# Patient Record
Sex: Female | Born: 1965
Health system: Southern US, Community
[De-identification: ages and names within clinical notes are randomized; demographics above are authoritative.]

## PROBLEM LIST (undated history)

## (undated) DIAGNOSIS — M069 Rheumatoid arthritis, unspecified: Secondary | ICD-10-CM

## (undated) DIAGNOSIS — R079 Chest pain, unspecified: Secondary | ICD-10-CM

## (undated) DIAGNOSIS — M1990 Unspecified osteoarthritis, unspecified site: Secondary | ICD-10-CM

## (undated) DIAGNOSIS — R002 Palpitations: Secondary | ICD-10-CM

## (undated) DIAGNOSIS — I447 Left bundle-branch block, unspecified: Secondary | ICD-10-CM

## (undated) DIAGNOSIS — Z800 Family history of malignant neoplasm of digestive organs: Secondary | ICD-10-CM

## (undated) HISTORY — DX: Unspecified osteoarthritis, unspecified site: M19.90

## (undated) HISTORY — DX: Palpitations: R00.2

## (undated) HISTORY — PX: TONSILLECTOMY: SUR1361

## (undated) HISTORY — DX: Family history of malignant neoplasm of digestive organs: Z80.0

## (undated) HISTORY — DX: Chest pain, unspecified: R07.9

## (undated) HISTORY — DX: Rheumatoid arthritis, unspecified: M06.9

## (undated) HISTORY — DX: Left bundle-branch block, unspecified: I44.7

---

## 1993-05-21 HISTORY — PX: DILATION AND CURETTAGE OF UTERUS: SHX78

## 1998-01-13 ENCOUNTER — Other Ambulatory Visit: Admission: RE | Admit: 1998-01-13 | Discharge: 1998-01-13 | Payer: Self-pay | Admitting: Obstetrics and Gynecology

## 1998-04-02 ENCOUNTER — Inpatient Hospital Stay (HOSPITAL_COMMUNITY): Admission: AD | Admit: 1998-04-02 | Discharge: 1998-04-02 | Payer: Self-pay | Admitting: Obstetrics and Gynecology

## 1998-05-14 ENCOUNTER — Inpatient Hospital Stay (HOSPITAL_COMMUNITY): Admission: AD | Admit: 1998-05-14 | Discharge: 1998-05-17 | Payer: Self-pay | Admitting: Obstetrics and Gynecology

## 1998-07-04 ENCOUNTER — Other Ambulatory Visit: Admission: RE | Admit: 1998-07-04 | Discharge: 1998-07-04 | Payer: Self-pay | Admitting: Obstetrics and Gynecology

## 1999-09-05 ENCOUNTER — Other Ambulatory Visit: Admission: RE | Admit: 1999-09-05 | Discharge: 1999-09-05 | Payer: Self-pay | Admitting: *Deleted

## 1999-12-20 ENCOUNTER — Other Ambulatory Visit: Admission: RE | Admit: 1999-12-20 | Discharge: 1999-12-20 | Payer: Self-pay | Admitting: Obstetrics and Gynecology

## 2000-07-12 ENCOUNTER — Inpatient Hospital Stay (HOSPITAL_COMMUNITY): Admission: AD | Admit: 2000-07-12 | Discharge: 2000-07-14 | Payer: Self-pay | Admitting: Obstetrics and Gynecology

## 2000-08-20 ENCOUNTER — Other Ambulatory Visit: Admission: RE | Admit: 2000-08-20 | Discharge: 2000-08-20 | Payer: Self-pay | Admitting: Obstetrics and Gynecology

## 2001-09-30 ENCOUNTER — Other Ambulatory Visit: Admission: RE | Admit: 2001-09-30 | Discharge: 2001-09-30 | Payer: Self-pay | Admitting: Obstetrics & Gynecology

## 2002-10-27 ENCOUNTER — Other Ambulatory Visit: Admission: RE | Admit: 2002-10-27 | Discharge: 2002-10-27 | Payer: Self-pay | Admitting: Obstetrics and Gynecology

## 2003-04-29 ENCOUNTER — Ambulatory Visit (HOSPITAL_COMMUNITY): Admission: RE | Admit: 2003-04-29 | Discharge: 2003-04-29 | Payer: Self-pay | Admitting: Obstetrics and Gynecology

## 2003-07-08 ENCOUNTER — Ambulatory Visit (HOSPITAL_COMMUNITY): Admission: RE | Admit: 2003-07-08 | Discharge: 2003-07-08 | Payer: Self-pay | Admitting: Obstetrics and Gynecology

## 2003-11-23 ENCOUNTER — Inpatient Hospital Stay (HOSPITAL_COMMUNITY): Admission: AD | Admit: 2003-11-23 | Discharge: 2003-11-25 | Payer: Self-pay | Admitting: Obstetrics and Gynecology

## 2004-03-21 ENCOUNTER — Other Ambulatory Visit: Admission: RE | Admit: 2004-03-21 | Discharge: 2004-03-21 | Payer: Self-pay | Admitting: Obstetrics and Gynecology

## 2005-07-18 ENCOUNTER — Other Ambulatory Visit: Admission: RE | Admit: 2005-07-18 | Discharge: 2005-07-18 | Payer: Self-pay | Admitting: Obstetrics and Gynecology

## 2005-10-05 ENCOUNTER — Ambulatory Visit: Payer: Self-pay | Admitting: Internal Medicine

## 2005-10-29 ENCOUNTER — Ambulatory Visit: Payer: Self-pay | Admitting: Internal Medicine

## 2006-09-03 ENCOUNTER — Encounter: Admission: RE | Admit: 2006-09-03 | Discharge: 2006-09-03 | Payer: Self-pay | Admitting: Obstetrics and Gynecology

## 2008-12-07 ENCOUNTER — Emergency Department (HOSPITAL_COMMUNITY): Admission: EM | Admit: 2008-12-07 | Discharge: 2008-12-08 | Payer: Self-pay | Admitting: Emergency Medicine

## 2008-12-08 ENCOUNTER — Encounter: Admission: RE | Admit: 2008-12-08 | Discharge: 2008-12-08 | Payer: Self-pay | Admitting: Obstetrics and Gynecology

## 2009-12-19 ENCOUNTER — Encounter: Admission: RE | Admit: 2009-12-19 | Discharge: 2009-12-19 | Payer: Self-pay | Admitting: Rheumatology

## 2010-10-06 NOTE — H&P (Signed)
[facility]  Patient:    Hannah Vincent, Hannah Vincent                        MRN: [id number] Adm. Date:  07/12/00 Attending:  Silverio Lay, M.D.                         History and Physical  REASON FOR ADMISSION:         Intrauterine pregnancy at 40 weeks and 5 days to undergo elective induction of labor.  HISTORY OF PRESENT ILLNESS:   This is a 45 year old married white female, gravida 4, para 2, abortus 1 with a due date of July 07, 2000, being admitted at 40 weeks and 5 days to undergo elective induction of labor.  She was last seen in the office on July 09, 2000, reporting good fetal activity, denying any bleeding or watery discharge, and denying any symptoms of pregnancy-induced hypertension.  She did report increased contraction pattern but never to a regular pattern.  An ultrasound done on that day estimated fetal weight at 8 pounds 6 ounces or 60th percentile with 8/8 biophysical profile and amniotic fluid index at 12.5 or 52nd percentile.  PRENATAL COURSE:              Blood type O positive.  Toxoplasmosis negative. RPR nonreactive.  Rubella immune.  HBsAg negative.  HIV nonreactive.  Pap smear within normal limits.  Gonorrhea negative.  Chlamydia negative. Twelve-week ultrasound changed estimated date of delivery to July 07, 2000.  A 16-week AFP was within normal limits; 16-week amniocentesis was declined; 22-week ultrasound revealed normal anatomy survey with posterior placenta and normal cervical length.  A 28-week glucose tolerance test was within normal limits and hemoglobin at 11.6.  A 32-week hemoglobin was 12.9. A 35-week group B strep was positive.  Prenatal course was otherwise uneventful.  ALLERGIES:                    No known drug allergies.  PAST MEDICAL HISTORY:         December 1997, partial molar pregnancy at 8 weeks, D&C, no complication.  May 1997, spontaneous vaginal delivery of a female infant at 39+ weeks weighing 8  pounds 2 ounces, no complications.  December 1999, spontaneous vaginal delivery of a female infant weighing 8 pounds 1 ounce at 39+ weeks, no complications.  FAMILY HISTORY:               Significant for mother with chronic hypertension and occult spina bifida.  SOCIAL HISTORY:               Married, nonsmoker, homemaker.  PHYSICAL EXAMINATION:  VITAL SIGNS:                  Current weight 158 pounds for a total weight gain of 37 pounds.  Blood pressure 112/70.  HEENT:                        Negative.  LUNGS:                        Clear.  HEART:                        Normal.  ABDOMEN:  Gravid, nontender.  Fundal height at 39 cm, vertex presentation.  PELVIC:                       Vaginal exam 2 cm, 50% effaced, vertex, -1.  EXTREMITIES:                  Negative.  ASSESSMENT:                   1. Intrauterine pregnancy at 40 weeks and                                  5 days.                               2. Positive group B Streptococcus.                               3. Favorable cervix.  PLAN:                         Admit to labor and delivery.  Proceed with artificial rupture of membranes and Pitocin induction.  Spontaneous vaginal delivery expected.DD:  07/10/00 TD:  07/10/00 Job: [id number]

## 2010-10-06 NOTE — H&P (Signed)
NAME:  Hannah Vincent, Hannah Vincent                         ACCOUNT NO.:  [id number]   MEDICAL RECORD NO.:  [id number]                   PATIENT TYPE:  INP   LOCATION:  9165                                 FACILITY:  [facility]   PHYSICIAN:  Naima A. Dillard, M.D.              DATE OF BIRTH:  1965-09-07   DATE OF ADMISSION:  11/23/2003  DATE OF DISCHARGE:                                HISTORY & PHYSICAL   HISTORY:  Ms. Hagey is a 45 year old gravida 5, para 3, 0, 1, 3 at 39-4/7  weeks who presented without calling with uterine contractions increasing  since 9 p.m.  She denies leaking or bleeding and reports positive fetal  movement.  Pregnancy has been remarkable for:  1. Positive group B strep.  2. Advanced maternal age, no amniocentesis.  3. History of partial molar pregnancy in 1995.  4. Family history of spina bifida  5. History of preterm labor with term delivery.   PRENATAL LABS:  Blood type is O positive. Rh antibody negative. VDRL  nonreactive. Rubella titer positive.  Hepatitis B surface antigen negative.  HIV declined.  Cystic fibrosis testing was negative.  GC/Chlamydia cultures  were negative in the first trimester as well as at 36 weeks.  Pap was  normal.  Hemoglobin upon entering the practice was 14.3 and was 12.2 at 29  weeks.  Estimated date of confinement of November 23, 2003 was established by  ultrasound in the first trimester secondary to slightly questionable LMP.  Group B strep culture was positive at 36 weeks.  One hour Glucola was  normal.   HISTORY OF PRESENT PREGNANCY:  The patient entered care at approximately 12  weeks.  She had an ultrasound at that time secondary to history of molar  pregnancy. She declined amniocentesis and quadruple screen.  She had an  ultrasound at 21 weeks that showed normal growth and development, a positive  nasal bone and cervix at 3.8 cm.  She slipped on the ice at 21 weeks, did  not strike her abdomen, did have an x-ray of her left wrist which  was  negative.  She had a possible Fifth's disease exposure at 24 weeks.  She had  immune parva titers noted.  Her Glucola was normal.  At 36 weeks she had an  ultrasound showing a 6 pound, 6 ounce fetus with estimated fetal weight 50  to 75th percentile, vertex with normal fluid.  Positive beta strep was noted  at 36 weeks with negative CG/Chlamydia.  The rest of her pregnancy was  essentially uncomplicated.   OBSTETRICAL HISTORY:  In 1995 she had a molar pregnancy diagnosed at  approximately 13 weeks; she did have a D&C (that surgery was accomplished in  [place]).  In 1997 she had a vaginal birth of a female infant, weight 8 pounds,  2 ounces at 40 weeks; she was in labor 40 hours; she had epidural anesthesia  (the  child was born in [place] with no problems).  In 1999 she had a  vaginal birth of a female infant, weight 8 pounds, 1 ounce at 40 weeks; she  was in labor 12 hours; she had epidural anesthesia and had no problems.  The  child was delivered at [facility] by Dr. Estanislado Pandy.  In 2002 she had a  vaginal birth of a female infant, weight 8 pounds, 3 ounces at 40 weeks.  She was in labor 5 hours; she had epidural anesthesia, she was induced  secondary to post-dates.  That child was also delivered by Dr. Estanislado Pandy.  In  her 64 pregnancy she did have some mild preterm labor at 34 weeks.  She  had a low lying placenta with her 1997 pregnancy that did resolve at term.  She had a small amount of possible preterm labor in her 2002 pregnancy but  delivered at term.   PAST MEDICAL HISTORY:  She reports occasional yeast infections.  She has  been a user of natural family planning.  She has an indoor cat but she did  not change the litter box.  She has a history of urinary tract infections in  the past.   PAST SURGICAL HISTORY:  Includes tonsillectomy and adenoidectomy in high  school.  A D&C with her molar pregnancy in 1995.   ALLERGIES:  No known drug allergies.   FAMILY HISTORY:   Paternal grandfather had a myocardial infarction.  Maternal  grandmother had a stroke.  Mother has hypertension.  An aunt has varicose veins.  Maternal grandfather  had colon cancer.   GENETIC HISTORY:  Remarkable for the patient being 37.  Her mother also has  scoliosis and spina bifida.   SOCIAL HISTORY:  The patient is married to the father of the baby.  He is  involved and supportive, his name is [employer].  The patient is college  educated, she is a Futures trader.  Her husband has a Music therapist, he is  employed as an Art gallery manager.  She has been followed by the physician service at  [facility].  She denies any alcohol, drug or tobacco use during  this pregnancy.   PHYSICAL EXAMINATION:  VITAL SIGNS:  Initial blood pressure was 149/97,  however, patient was contracting very frequently.  Other vital signs are  stable. Uterine contractions every 2 to 3 minutes, 60 to 70 seconds in  duration, strong quality.  Fetal heart rate is reassuring with an occasional  mild variable with contraction.  PELVIC EXAM:  Cervix slightly posterior, 6 cm, 80%, vertex and -2 station  with an intact bag of water.  CHEST:  Clear.  HEENT:  Within normal limits.  LUNGS:  Breath sounds are clear.  HEART:  Regular rate and rhythm without murmur.  BREASTS:  Are soft and nontender.  ABDOMEN:  Fundal height approximately 38 cm, estimated fetal weight is 8  pounds.  EXTREMITIES:  Deep tendon reflexes are 2+ without clonus.  There is trace  edema noted.   IMPRESSION:  1. Intrauterine pregnancy at 39-4/7 weeks.  2. Active labor.  3. Positive group B strep.   PLAN:  1. Admit to birthing suite for consult with Dr. Normand Sloop as attending     physician.  2. Routine physician orders.  3. Plan group B strep prophylaxis with ampicillin 2 grams IV q.6h secondary     to possible rapidly advancing labor.  4. Patient plans epidural. 5. Medical doctors will follow.     Chip Boer  Carole Binning, C.N.M.                    Naima A. Normand Sloop, M.D.    Leeanne Mannan  D:  11/23/2003  T:  11/23/2003  Job:  [id number]

## 2011-06-06 ENCOUNTER — Other Ambulatory Visit: Payer: Self-pay | Admitting: Obstetrics and Gynecology

## 2011-06-06 DIAGNOSIS — Z1231 Encounter for screening mammogram for malignant neoplasm of breast: Secondary | ICD-10-CM

## 2011-06-22 ENCOUNTER — Ambulatory Visit
Admission: RE | Admit: 2011-06-22 | Discharge: 2011-06-22 | Disposition: A | Discharge status: Home / Self Care | Payer: BC Managed Care – PPO | Source: Ambulatory Visit | Attending: Obstetrics and Gynecology | Admitting: Obstetrics and Gynecology

## 2011-06-22 DIAGNOSIS — Z1231 Encounter for screening mammogram for malignant neoplasm of breast: Secondary | ICD-10-CM

## 2011-10-09 DIAGNOSIS — Z800 Family history of malignant neoplasm of digestive organs: Secondary | ICD-10-CM | POA: Insufficient documentation

## 2011-10-09 DIAGNOSIS — M069 Rheumatoid arthritis, unspecified: Secondary | ICD-10-CM | POA: Insufficient documentation

## 2011-10-12 ENCOUNTER — Encounter: Payer: Self-pay | Admitting: Obstetrics and Gynecology

## 2011-10-16 ENCOUNTER — Ambulatory Visit: Payer: Self-pay | Admitting: Obstetrics and Gynecology

## 2012-01-07 ENCOUNTER — Telehealth: Payer: Self-pay | Admitting: Obstetrics and Gynecology

## 2012-01-07 NOTE — Telephone Encounter (Signed)
PC from pt requesting advice for mother.  States she is having horrible urgency, no burning or stinging.  Notified pt that I cannot give medical advice over the phone for a non-patient, but suggest she establish PCP or visit urgent care.  Pt had questions about something she had seen in our office that said "if you have to run to the bathroom...." . Told pt that is an advertisement for Lumax testing.  Pt wanted to know if her mother could have that test.  I notified pt once again that I cannot give medical advise to a non-pt, but would suggest she establish a PCP or visit urgent care. Pt wanted to know a good PCP. Notified pt that SR refers a lot to Dr Dorothyann Peng.  Pt agreeable.  ld

## 2012-10-09 ENCOUNTER — Encounter: Payer: Self-pay | Admitting: Internal Medicine

## 2013-04-29 ENCOUNTER — Encounter: Payer: Self-pay | Admitting: Internal Medicine

## 2013-08-06 ENCOUNTER — Encounter: Payer: Self-pay | Admitting: Internal Medicine

## 2014-02-24 ENCOUNTER — Other Ambulatory Visit: Payer: Self-pay

## 2014-02-24 DIAGNOSIS — Z1239 Encounter for other screening for malignant neoplasm of breast: Secondary | ICD-10-CM

## 2014-03-22 ENCOUNTER — Encounter: Payer: Self-pay | Admitting: Obstetrics and Gynecology

## 2014-04-01 ENCOUNTER — Other Ambulatory Visit: Payer: Self-pay

## 2014-04-01 ENCOUNTER — Ambulatory Visit
Admission: RE | Admit: 2014-04-01 | Discharge: 2014-04-01 | Disposition: A | Discharge status: Home / Self Care | Payer: BC Managed Care – PPO | Source: Ambulatory Visit

## 2014-04-01 DIAGNOSIS — Z1231 Encounter for screening mammogram for malignant neoplasm of breast: Secondary | ICD-10-CM

## 2015-10-05 DIAGNOSIS — Z79899 Other long term (current) drug therapy: Secondary | ICD-10-CM | POA: Diagnosis not present

## 2015-10-05 DIAGNOSIS — M0589 Other rheumatoid arthritis with rheumatoid factor of multiple sites: Secondary | ICD-10-CM | POA: Diagnosis not present

## 2015-10-05 DIAGNOSIS — M0609 Rheumatoid arthritis without rheumatoid factor, multiple sites: Secondary | ICD-10-CM | POA: Diagnosis not present

## 2016-02-15 DIAGNOSIS — Z79899 Other long term (current) drug therapy: Secondary | ICD-10-CM | POA: Diagnosis not present

## 2016-03-26 DIAGNOSIS — L309 Dermatitis, unspecified: Secondary | ICD-10-CM | POA: Diagnosis not present

## 2016-03-30 DIAGNOSIS — L309 Dermatitis, unspecified: Secondary | ICD-10-CM | POA: Diagnosis not present

## 2016-04-06 DIAGNOSIS — Z79899 Other long term (current) drug therapy: Secondary | ICD-10-CM | POA: Diagnosis not present

## 2016-04-06 DIAGNOSIS — M0589 Other rheumatoid arthritis with rheumatoid factor of multiple sites: Secondary | ICD-10-CM | POA: Diagnosis not present

## 2016-04-17 DIAGNOSIS — Z01419 Encounter for gynecological examination (general) (routine) without abnormal findings: Secondary | ICD-10-CM | POA: Diagnosis not present

## 2016-04-17 DIAGNOSIS — Z6823 Body mass index (BMI) 23.0-23.9, adult: Secondary | ICD-10-CM | POA: Diagnosis not present

## 2016-04-17 DIAGNOSIS — Z3040 Encounter for surveillance of contraceptives, unspecified: Secondary | ICD-10-CM | POA: Diagnosis not present

## 2016-04-17 DIAGNOSIS — Z1231 Encounter for screening mammogram for malignant neoplasm of breast: Secondary | ICD-10-CM | POA: Diagnosis not present

## 2016-09-10 DIAGNOSIS — Z1211 Encounter for screening for malignant neoplasm of colon: Secondary | ICD-10-CM | POA: Diagnosis not present

## 2016-10-17 DIAGNOSIS — Z79899 Other long term (current) drug therapy: Secondary | ICD-10-CM | POA: Diagnosis not present

## 2016-10-17 DIAGNOSIS — M0589 Other rheumatoid arthritis with rheumatoid factor of multiple sites: Secondary | ICD-10-CM | POA: Diagnosis not present

## 2017-02-11 DIAGNOSIS — M79641 Pain in right hand: Secondary | ICD-10-CM | POA: Diagnosis not present

## 2017-02-11 DIAGNOSIS — M79642 Pain in left hand: Secondary | ICD-10-CM | POA: Diagnosis not present

## 2017-02-11 DIAGNOSIS — M0589 Other rheumatoid arthritis with rheumatoid factor of multiple sites: Secondary | ICD-10-CM | POA: Diagnosis not present

## 2017-02-11 DIAGNOSIS — Z79899 Other long term (current) drug therapy: Secondary | ICD-10-CM | POA: Diagnosis not present

## 2017-04-24 DIAGNOSIS — M542 Cervicalgia: Secondary | ICD-10-CM | POA: Diagnosis not present

## 2017-04-24 DIAGNOSIS — M2550 Pain in unspecified joint: Secondary | ICD-10-CM | POA: Diagnosis not present

## 2017-04-24 DIAGNOSIS — M0609 Rheumatoid arthritis without rheumatoid factor, multiple sites: Secondary | ICD-10-CM | POA: Diagnosis not present

## 2017-05-01 DIAGNOSIS — Z6823 Body mass index (BMI) 23.0-23.9, adult: Secondary | ICD-10-CM | POA: Diagnosis not present

## 2017-05-01 DIAGNOSIS — Z01419 Encounter for gynecological examination (general) (routine) without abnormal findings: Secondary | ICD-10-CM | POA: Diagnosis not present

## 2017-05-01 DIAGNOSIS — Z1231 Encounter for screening mammogram for malignant neoplasm of breast: Secondary | ICD-10-CM | POA: Diagnosis not present

## 2017-05-01 DIAGNOSIS — Z124 Encounter for screening for malignant neoplasm of cervix: Secondary | ICD-10-CM | POA: Diagnosis not present

## 2017-05-01 DIAGNOSIS — Z3040 Encounter for surveillance of contraceptives, unspecified: Secondary | ICD-10-CM | POA: Diagnosis not present

## 2017-06-05 DIAGNOSIS — M542 Cervicalgia: Secondary | ICD-10-CM | POA: Diagnosis not present

## 2017-06-05 DIAGNOSIS — M47812 Spondylosis without myelopathy or radiculopathy, cervical region: Secondary | ICD-10-CM | POA: Diagnosis not present

## 2017-07-24 DIAGNOSIS — M2550 Pain in unspecified joint: Secondary | ICD-10-CM | POA: Diagnosis not present

## 2017-07-24 DIAGNOSIS — Z79899 Other long term (current) drug therapy: Secondary | ICD-10-CM | POA: Diagnosis not present

## 2017-07-24 DIAGNOSIS — M0589 Other rheumatoid arthritis with rheumatoid factor of multiple sites: Secondary | ICD-10-CM | POA: Diagnosis not present

## 2017-10-23 DIAGNOSIS — M2550 Pain in unspecified joint: Secondary | ICD-10-CM | POA: Diagnosis not present

## 2017-10-23 DIAGNOSIS — M0589 Other rheumatoid arthritis with rheumatoid factor of multiple sites: Secondary | ICD-10-CM | POA: Diagnosis not present

## 2017-10-23 DIAGNOSIS — Z79899 Other long term (current) drug therapy: Secondary | ICD-10-CM | POA: Diagnosis not present

## 2017-10-23 DIAGNOSIS — M150 Primary generalized (osteo)arthritis: Secondary | ICD-10-CM | POA: Diagnosis not present

## 2018-02-24 DIAGNOSIS — M150 Primary generalized (osteo)arthritis: Secondary | ICD-10-CM | POA: Diagnosis not present

## 2018-02-24 DIAGNOSIS — Z79899 Other long term (current) drug therapy: Secondary | ICD-10-CM | POA: Diagnosis not present

## 2018-02-24 DIAGNOSIS — M2550 Pain in unspecified joint: Secondary | ICD-10-CM | POA: Diagnosis not present

## 2018-02-24 DIAGNOSIS — M0589 Other rheumatoid arthritis with rheumatoid factor of multiple sites: Secondary | ICD-10-CM | POA: Diagnosis not present

## 2018-05-27 DIAGNOSIS — J40 Bronchitis, not specified as acute or chronic: Secondary | ICD-10-CM | POA: Diagnosis not present

## 2018-06-04 DIAGNOSIS — M0589 Other rheumatoid arthritis with rheumatoid factor of multiple sites: Secondary | ICD-10-CM | POA: Diagnosis not present

## 2018-06-04 DIAGNOSIS — Z01419 Encounter for gynecological examination (general) (routine) without abnormal findings: Secondary | ICD-10-CM | POA: Diagnosis not present

## 2018-06-04 DIAGNOSIS — Z1211 Encounter for screening for malignant neoplasm of colon: Secondary | ICD-10-CM | POA: Diagnosis not present

## 2018-06-04 DIAGNOSIS — Z3040 Encounter for surveillance of contraceptives, unspecified: Secondary | ICD-10-CM | POA: Diagnosis not present

## 2018-06-04 DIAGNOSIS — Z1231 Encounter for screening mammogram for malignant neoplasm of breast: Secondary | ICD-10-CM | POA: Diagnosis not present

## 2018-09-01 DIAGNOSIS — M150 Primary generalized (osteo)arthritis: Secondary | ICD-10-CM | POA: Diagnosis not present

## 2018-09-01 DIAGNOSIS — M2550 Pain in unspecified joint: Secondary | ICD-10-CM | POA: Diagnosis not present

## 2018-09-01 DIAGNOSIS — M0589 Other rheumatoid arthritis with rheumatoid factor of multiple sites: Secondary | ICD-10-CM | POA: Diagnosis not present

## 2018-09-03 DIAGNOSIS — M0589 Other rheumatoid arthritis with rheumatoid factor of multiple sites: Secondary | ICD-10-CM | POA: Diagnosis not present

## 2018-09-16 DIAGNOSIS — R079 Chest pain, unspecified: Secondary | ICD-10-CM | POA: Diagnosis not present

## 2018-09-16 DIAGNOSIS — R002 Palpitations: Secondary | ICD-10-CM | POA: Diagnosis not present

## 2018-09-16 DIAGNOSIS — I447 Left bundle-branch block, unspecified: Secondary | ICD-10-CM | POA: Diagnosis not present

## 2018-09-17 ENCOUNTER — Telehealth: Payer: Self-pay | Admitting: *Deleted

## 2018-09-17 NOTE — Telephone Encounter (Signed)
REFERRAL SENT TO [facility] AND NOTES ON FILE FROM [place], [place] [phone].

## 2018-09-22 ENCOUNTER — Telehealth: Payer: Self-pay | Admitting: Cardiology

## 2018-09-22 NOTE — Telephone Encounter (Signed)
Virtual Visit Pre-Appointment Phone Call  "(Name), I am calling you today to discuss your upcoming appointment. We are currently trying to limit exposure to the virus that causes COVID-19 by seeing patients at home rather than in the office."  1. "What is the BEST phone number to call the day of the visit?" - include this in appointment notes  2. Do you have or have access to (through a family member/friend) a smartphone with video capability that we can use for your visit?" a. If yes - list this number in appt notes as cell (if different from BEST phone #) and list the appointment type as a VIDEO visit in appointment notes b. If no - list the appointment type as a PHONE visit in appointment notes  3. Confirm consent - "In the setting of the current Covid19 crisis, you are scheduled for a (phone or video) visit with your provider on (date) at (time).  Just as we do with many in-office visits, in order for you to participate in this visit, we must obtain consent.  If you'd like, I can send this to your mychart (if signed up) or email for you to review.  Otherwise, I can obtain your verbal consent now.  All virtual visits are billed to your insurance company just like a normal visit would be.  By agreeing to a virtual visit, we'd like you to understand that the technology does not allow for your provider to perform an examination, and thus may limit your provider's ability to fully assess your condition. If your provider identifies any concerns that need to be evaluated in person, we will make arrangements to do so.  Finally, though the technology is pretty good, we cannot assure that it will always work on either your or our end, and in the setting of a video visit, we may have to convert it to a phone-only visit.  In either situation, we cannot ensure that we have a secure connection.  Are you willing to proceed?  Yes/New pt/Telephone only/ [phone]/verbal consent 09/22/18/vitals  4. Advise  patient to be prepared - 5.  "Two hours prior to your appointment, go ahead and check your blood pressure, pulse, oxygen saturation, and your weight (if you have the equipment to check those) and write them all down. When your visit starts, your provider will ask you for this information. If you have an Apple Watch or Kardia device, please plan to have heart rate information ready on the day of your appointment. Please have a pen and paper handy nearby the day of the visit as well."  6. Give patient instructions for MyChart download to smartphone OR Doximity/Doxy.me as below if video visit (depending on what platform provider is using)  7. Inform patient they will receive a phone call 15 minutes prior to their appointment time (may be from unknown caller ID) so they should be prepared to answer    TELEPHONE CALL NOTE  Hannah Vincent has been deemed a candidate for a follow-up tele-health visit to limit community exposure during the Covid-19 pandemic. I spoke with the patient via phone to ensure availability of phone/video source, confirm preferred email & phone number, and discuss instructions and expectations.  I reminded Hannah Vincent to be prepared with any vital sign and/or heart rhythm information that could potentially be obtained via home monitoring, at the time of her visit. I reminded Hannah Vincent to expect a phone call prior to her visit.  Hannah Vincent 09/22/2018 3:45 PM   INSTRUCTIONS FOR DOWNLOADING THE MYCHART APP TO SMARTPHONE  - The patient must first make sure to have activated MyChart and know their login information - If Apple, go to [employer] and type in MyChart in the search bar and download the app. If Android, ask patient to go to [employer] and type in [place] in the search bar and download the app. The app is free but as with any other app downloads, their phone may require them to verify saved payment information or Apple/Android password.  - The patient  will need to then log into the app with their MyChart username and password, and select [facility] as their healthcare provider to link the account. When it is time for your visit, go to the MyChart app, find appointments, and click Begin Video Visit. Be sure to Select Allow for your device to access the Microphone and Camera for your visit. You will then be connected, and your provider will be with you shortly.  **If they have any issues connecting, or need assistance please contact MyChart service desk [phone])**  **If using a computer, in order to ensure the best quality for their visit they will need to use either of the following Internet Browsers: [employer] or Google Chrome**  IF USING DOXIMITY or DOXY.ME - The patient will receive a link just prior to their visit by text.     FULL LENGTH CONSENT FOR TELE-HEALTH VISIT   I hereby voluntarily request, consent and authorize [facility] and its employed or contracted physicians, physician assistants, nurse practitioners or other licensed health care professionals (the Practitioner), to provide me with telemedicine health care services (the Services") as deemed necessary by the treating Practitioner. I acknowledge and consent to receive the Services by the Practitioner via telemedicine. I understand that the telemedicine visit will involve communicating with the Practitioner through live audiovisual communication technology and the disclosure of certain medical information by electronic transmission. I acknowledge that I have been given the opportunity to request an in-person assessment or other available alternative prior to the telemedicine visit and am voluntarily participating in the telemedicine visit.  I understand that I have the right to withhold or withdraw my consent to the use of telemedicine in the course of my care at any time, without affecting my right to future care or treatment, and that the Practitioner  or I may terminate the telemedicine visit at any time. I understand that I have the right to inspect all information obtained and/or recorded in the course of the telemedicine visit and may receive copies of available information for a reasonable fee.  I understand that some of the potential risks of receiving the Services via telemedicine include:   Delay or interruption in medical evaluation due to technological equipment failure or disruption;  Information transmitted may not be sufficient (e.g. poor resolution of images) to allow for appropriate medical decision making by the Practitioner; and/or   In rare instances, security protocols could fail, causing a breach of personal health information.  Furthermore, I acknowledge that it is my responsibility to provide information about my medical history, conditions and care that is complete and accurate to the best of my ability. I acknowledge that Practitioner's advice, recommendations, and/or decision may be based on factors not within their control, such as incomplete or inaccurate data provided by me or distortions of diagnostic images or specimens that may result from electronic transmissions. I understand that the  practice of medicine is not an Chief Strategy Officer and that Practitioner makes no warranties or guarantees regarding treatment outcomes. I acknowledge that I will receive a copy of this consent concurrently upon execution via email to the email address I last provided but may also request a printed copy by calling the office of [facility].    I understand that my insurance will be billed for this visit.   I have read or had this consent read to me.  I understand the contents of this consent, which adequately explains the benefits and risks of the Services being provided via telemedicine.   I have been provided ample opportunity to ask questions regarding this consent and the Services and have had my questions answered to my  satisfaction.  I give my informed consent for the services to be provided through the use of telemedicine in my medical care  By participating in this telemedicine visit I agree to the above.

## 2018-09-23 NOTE — Progress Notes (Signed)
Virtual Visit via Video Note   This visit type was conducted due to national recommendations for restrictions regarding the COVID-19 Pandemic (e.g. social distancing) in an effort to limit this patient's exposure and mitigate transmission in our community.  Due to her co-morbid illnesses, this patient is at least at moderate risk for complications without adequate follow up.  This format is felt to be most appropriate for this patient at this time.  All issues noted in this document were discussed and addressed.  A limited physical exam was performed with this format.  Please refer to the patient's chart for her consent to telehealth for [facility].   Evaluation Performed: Cardiology consult  This visit type was conducted due to national recommendations for restrictions regarding the COVID-19 Pandemic (e.g. social distancing).  This format is felt to be most appropriate for this patient at this time.  All issues noted in this document were discussed and addressed.  No physical exam was performed (except for noted visual exam findings with Video Visits).  Please refer to the patient's chart (MyChart message for video visits and phone note for telephone visits) for the patient's consent to telehealth for [facility].  Date:  09/24/2018   ID:  Hannah Vincent, DOB 01/28/1966, MRN [id number]  Patient Location:  Home  Provider location:   [place]  PCP: Hannah Gemma, PA  Cardiologist:  NEW Electrophysiologist:  None   Chief Complaint:  Palpitations and LBBB  History of Present Illness:    Hannah Vincent is a 53 y.o. female who presents via audio/video conferencing for a telehealth visit today in referral by Hannah Gemma PA-C for evaluation of LBBB, chest pain and palpitations.   This is a very pleasant 53yo female with a history of RA who was seen for recent OV and complained that she was having sporadic palpitations as well as chest pain.  On EKG she was found to have a LBBB  with no old EKG to compare. Unfortunately she lost both of her parents within a 5 months time frame and has been very stressed.    She tells me that the CP is very sporadic and has been occurring for over several weeks.  She describes it as a pressure that is midsternal with no radiation to her arms or neck.  There is no associated nausea, diaphoresis or shortness of breath.  She says sometimes she will feel it during the day but notices it at night.  She said a couple times she is gone walking or jogging and has had no chest pain but then when she gets back and rest she starts to have the pressure in her chest.  She says this week her chest pain and palpitations have been a little bit better than before.  2 weeks prior to this though the palpitations and chest discomfort were very bad especially at night.  She also will feel a fullness in her throat and feel her heart racing.   She has never smoked.  She drinks 1 large cup of coffee in the morning and occasionally drinks alcohol but not very often.  She denies any SOB, DOE, LE edema or syncope.  She sometimes will feel dizzy with the palpitations.  Her dad had an MI late in life but died of a cerebral bleed after hitting his head on blood thinners.  Her mother is deceased and had hyper tension and CHF.  The patient does not have symptoms concerning for COVID-19 infection (fever, chills, cough,  or new shortness of breath).    Prior CV studies:   The following studies were reviewed today:  none  Past Medical History:  Diagnosis Date   Arthritis    rheumatoid   Chest pain    Family history of colon cancer    LBBB (left bundle branch block)    Palpitations    RA (rheumatoid arthritis) (HCC)    Past Surgical History:  Procedure Laterality Date   DILATION AND CURETTAGE OF UTERUS  1995   partial mole   TONSILLECTOMY       Current Meds  Medication Sig   Calcium Carbonate-Vitamin D (CALCIUM 500 + D PO) Take by mouth.   folic acid  (FOLVITE) 1 MG tablet Take 1 mg by mouth daily.   meloxicam (MOBIC) 15 MG tablet Take 15 mg by mouth daily.   methotrexate (RHEUMATREX) 2.5 MG tablet Take 2.5 mg by mouth once a week. Caution:Chemotherapy. Protect from light.     Allergies:   Other   Social History   Tobacco Use   Smoking status: Never Smoker   Smokeless tobacco: Never Used  Substance Use Topics   Alcohol use: No   Drug use: No     Family Hx: The patient's family history includes Birth defects in her mother; Cancer (age of onset: 31) in her maternal grandfather; Heart disease in her father; Hypertension in her mother.  ROS:   Please see the history of present illness.     All other systems reviewed and are negative.   Labs/Other Tests and Data Reviewed:    Recent Labs: No results found for requested labs within last 8760 hours.   Recent Lipid Panel No results found for: CHOL, TRIG, HDL, CHOLHDL, LDLCALC, LDLDIRECT  Wt Readings from Last 3 Encounters:  No data found for Wt     Objective:    Vital Signs:  BP 125/81    Pulse 61    Ht 5\' 4"  (1.626 m)    CONSTITUTIONAL:  Well nourished, well developed female in no acute distress.  EYES: anicteric MOUTH: oral mucosa is pink RESPIRATORY: Normal respiratory effort, symmetric expansion CARDIOVASCULAR: No peripheral edema SKIN: No rash, lesions or ulcers MUSCULOSKELETAL: no digital cyanosis NEURO: Cranial Nerves II-XII grossly intact, moves all extremities PSYCH: Intact judgement and insight.  A&O x 3, Mood/affect appropriate   ASSESSMENT & PLAN:    1.  Chest pain -her chest pain is somewhat atypical in that it mainly occurs at night when she is in bed.  She does notice that she can go out on a jog or walk and will not get chest pain during the exercise but when she stopped exercising she may feel some pressure in her chest.  There are no associated symptoms and no radiation of the discomfort.  She does have cardiac risk factors including a family  history of heart disease although later in life and she has autoimmune disorder with her rheumatoid arthritis.  She has never smoked.  Her EKG is abnormal and left bundle branch block.  I recommended a Lexiscan Myoview to rule out ischemia.  I will also get a 2D echocardiogram to make sure LV function is normal.  2.  Palpitations - she can feel her heart racing at any time but especially at night.  I will get a long term ziopatch to assess for arrhythmias especially PAF.  3.  LBBB - I do not know if this is new as there are no prior EKGs to compare to.  4.  COVID-19 Education:The signs and symptoms of COVID-19 were discussed with the patient and how to seek care for testing (follow up with PCP or arrange E-visit).  The importance of social distancing was discussed today.  Patient Risk:   After full review of this patient's clinical status, I feel that they are at least moderate risk at this time.  Time:   Today, I have spent 20 minutes directly with the patient on video discussing medical problems including CP, palpitations, abnormal EKG.  We also reviewed the symptoms of COVID 19 and the ways to protect against contracting the virus with telehealth technology.  I spent an additional 5 minutes reviewing patient's chart including office notes from PCP.  Medication Adjustments/Labs and Tests Ordered: Current medicines are reviewed at length with the patient today.  Concerns regarding medicines are outlined above.  Tests Ordered: No orders of the defined types were placed in this encounter.  Medication Changes: No orders of the defined types were placed in this encounter.   Disposition:  Follow up PRN  Signed, Armanda Magicraci Durward Matranga, MD  09/24/2018 9:37 AM    [facility]

## 2018-09-24 ENCOUNTER — Encounter: Payer: Self-pay | Admitting: Cardiology

## 2018-09-24 ENCOUNTER — Other Ambulatory Visit: Payer: Self-pay

## 2018-09-24 ENCOUNTER — Telehealth: Payer: Self-pay | Admitting: Radiology

## 2018-09-24 ENCOUNTER — Telehealth (INDEPENDENT_AMBULATORY_CARE_PROVIDER_SITE_OTHER): Payer: BLUE CROSS/BLUE SHIELD | Admitting: Cardiology

## 2018-09-24 VITALS — BP 125/81 | HR 61 | Ht 64.0 in

## 2018-09-24 DIAGNOSIS — Z7189 Other specified counseling: Secondary | ICD-10-CM

## 2018-09-24 DIAGNOSIS — I447 Left bundle-branch block, unspecified: Secondary | ICD-10-CM

## 2018-09-24 DIAGNOSIS — R0789 Other chest pain: Secondary | ICD-10-CM

## 2018-09-24 DIAGNOSIS — R002 Palpitations: Secondary | ICD-10-CM

## 2018-09-24 DIAGNOSIS — R079 Chest pain, unspecified: Secondary | ICD-10-CM

## 2018-09-24 NOTE — Telephone Encounter (Signed)
Enrolled patient for a 14 day Zio long Term monitor to be mailed due to covid-19. Brief instructions were gone over with the patient and she knows to expect the monitor to arrive in 3-4 days.  

## 2018-09-24 NOTE — Addendum Note (Signed)
Addended by: Dustin Flock on: 09/24/2018 11:39 AM   Modules accepted: Orders

## 2018-09-24 NOTE — Patient Instructions (Signed)
Medication Instructions:  Your physician recommends that you continue on your current medications as directed. Please refer to the Current Medication list given to you today.  If you need a refill on your cardiac medications before your next appointment, please call your pharmacy.   Lab work: None If you have labs (blood work) drawn today and your tests are completely normal, you will receive your results only by: Marland Kitchen MyChart Message (if you have MyChart) OR . A paper copy in the mail If you have any lab test that is abnormal or we need to change your treatment, we will call you to review the results.  Testing/Procedures: Your physician has requested that you have an echocardiogram. Echocardiography is a painless test that uses sound waves to create images of your heart. It provides your doctor with information about the size and shape of your heart and how well your heart's chambers and valves are working. This procedure takes approximately one hour. There are no restrictions for this procedure.  Your physician has requested that you have a lexiscan myoview. For further information please visit [email or web address] Please follow instruction sheet, as given.  Schedule a Zio Patch Monitor, 14 day  Follow-Up: As needed, pending results.

## 2018-10-01 ENCOUNTER — Ambulatory Visit (INDEPENDENT_AMBULATORY_CARE_PROVIDER_SITE_OTHER): Payer: BLUE CROSS/BLUE SHIELD

## 2018-10-01 DIAGNOSIS — R002 Palpitations: Secondary | ICD-10-CM

## 2018-10-17 ENCOUNTER — Telehealth (HOSPITAL_COMMUNITY): Payer: Self-pay | Admitting: *Deleted

## 2018-10-17 ENCOUNTER — Telehealth (HOSPITAL_COMMUNITY): Payer: Self-pay | Admitting: Cardiology

## 2018-10-17 NOTE — Telephone Encounter (Signed)
Patient given detailed instructions per Myocardial Perfusion Study Information Sheet for the test on 10/20/18 at 10:45. Patient notified to arrive 15 minutes early and that it is imperative to arrive on time for appointment to keep from having the test rescheduled.  If you need to cancel or reschedule your appointment, please call the office within 24 hours of your appointment. . Patient verbalized understanding.Daneil Dolin

## 2018-10-20 ENCOUNTER — Ambulatory Visit (HOSPITAL_COMMUNITY): Payer: BLUE CROSS/BLUE SHIELD | Attending: Cardiovascular Disease

## 2018-10-20 ENCOUNTER — Other Ambulatory Visit: Payer: Self-pay

## 2018-10-20 ENCOUNTER — Encounter (HOSPITAL_COMMUNITY): Payer: Self-pay

## 2018-10-20 ENCOUNTER — Ambulatory Visit (HOSPITAL_BASED_OUTPATIENT_CLINIC_OR_DEPARTMENT_OTHER): Payer: BLUE CROSS/BLUE SHIELD

## 2018-10-20 DIAGNOSIS — R002 Palpitations: Secondary | ICD-10-CM

## 2018-10-20 DIAGNOSIS — R0789 Other chest pain: Secondary | ICD-10-CM

## 2018-10-20 LAB — MYOCARDIAL PERFUSION IMAGING
LV dias vol: 84 mL (ref 46–106)
LV sys vol: 36 mL
Peak HR: 111 {beats}/min
Rest HR: 59 {beats}/min
SDS: 2
SRS: 2
SSS: 4
TID: 0.99

## 2018-10-20 MED ORDER — TECHNETIUM TC 99M TETROFOSMIN IV KIT
31.5000 | PACK | Freq: Once | INTRAVENOUS | Status: AC | PRN
  Administered 2018-10-20: 31.5 via INTRAVENOUS
  Filled 2018-10-20: qty 32

## 2018-10-20 MED ORDER — REGADENOSON 0.4 MG/5ML IV SOLN
0.4000 mg | Freq: Once | INTRAVENOUS | Status: AC
  Administered 2018-10-20: 0.4 mg via INTRAVENOUS

## 2018-10-20 MED ORDER — TECHNETIUM TC 99M TETROFOSMIN IV KIT
10.9000 | PACK | Freq: Once | INTRAVENOUS | Status: AC | PRN
  Administered 2018-10-20: 10.9 via INTRAVENOUS
  Filled 2018-10-20: qty 11

## 2018-10-21 NOTE — Telephone Encounter (Signed)
No answer

## 2018-10-21 NOTE — Telephone Encounter (Signed)
No Answer/Busy - Attempted to call patient multiple times, no answer. No message left

## 2018-10-22 ENCOUNTER — Telehealth: Payer: Self-pay

## 2018-10-22 DIAGNOSIS — I447 Left bundle-branch block, unspecified: Secondary | ICD-10-CM

## 2018-10-22 NOTE — Telephone Encounter (Signed)
Spoke with the patient, she expressed understanding and accepted having a calcium score.

## 2018-10-22 NOTE — Telephone Encounter (Signed)
-----   Message from Quintella Reichert, MD sent at 10/21/2018 12:04 PM EDT ----- Given her risk factors  as well as left bundle branch block despite normal nuclear stress test please get a coronary calcium score

## 2018-10-23 DIAGNOSIS — R002 Palpitations: Secondary | ICD-10-CM | POA: Diagnosis not present

## 2018-10-24 ENCOUNTER — Other Ambulatory Visit: Payer: Self-pay

## 2018-11-12 ENCOUNTER — Telehealth: Payer: Self-pay

## 2018-11-12 NOTE — Telephone Encounter (Signed)
Copied from [place] [id number]. Topic: General - Other >> Nov 12, 2018  9:30 AM Oneta Rack wrote: Relation to pt: self  Call back number: [phone]    Reason for call:  Patient wanted to make PCP (Dr. Juleen China) aware Dr. Eber Hong. Radford Pax, MD cardiologist will fax over medical records to [phone], please note when received. Patient would like PCP to review prior to 11/19/2018 NPA.

## 2018-11-14 NOTE — Telephone Encounter (Signed)
Document received and are in Dr. Juleen China box for review.

## 2018-11-19 ENCOUNTER — Ambulatory Visit (INDEPENDENT_AMBULATORY_CARE_PROVIDER_SITE_OTHER): Payer: BC Managed Care – PPO | Admitting: Family Medicine

## 2018-11-19 ENCOUNTER — Encounter: Payer: Self-pay | Admitting: Family Medicine

## 2018-11-19 ENCOUNTER — Ambulatory Visit (INDEPENDENT_AMBULATORY_CARE_PROVIDER_SITE_OTHER): Payer: BC Managed Care – PPO

## 2018-11-19 ENCOUNTER — Other Ambulatory Visit: Payer: Self-pay

## 2018-11-19 VITALS — BP 105/70 | HR 69 | Temp 99.1°F | Ht 64.75 in | Wt 137.4 lb

## 2018-11-19 DIAGNOSIS — G8929 Other chronic pain: Secondary | ICD-10-CM

## 2018-11-19 DIAGNOSIS — M542 Cervicalgia: Secondary | ICD-10-CM | POA: Diagnosis not present

## 2018-11-19 DIAGNOSIS — E049 Nontoxic goiter, unspecified: Secondary | ICD-10-CM | POA: Diagnosis not present

## 2018-11-19 DIAGNOSIS — M47812 Spondylosis without myelopathy or radiculopathy, cervical region: Secondary | ICD-10-CM | POA: Diagnosis not present

## 2018-11-19 DIAGNOSIS — N915 Oligomenorrhea, unspecified: Secondary | ICD-10-CM

## 2018-11-19 DIAGNOSIS — M50322 Other cervical disc degeneration at C5-C6 level: Secondary | ICD-10-CM | POA: Diagnosis not present

## 2018-11-19 DIAGNOSIS — E559 Vitamin D deficiency, unspecified: Secondary | ICD-10-CM

## 2018-11-19 DIAGNOSIS — M069 Rheumatoid arthritis, unspecified: Secondary | ICD-10-CM

## 2018-11-19 DIAGNOSIS — G478 Other sleep disorders: Secondary | ICD-10-CM

## 2018-11-19 LAB — COMPREHENSIVE METABOLIC PANEL
ALT: 14 U/L (ref 0–35)
AST: 15 U/L (ref 0–37)
Albumin: 4.5 g/dL (ref 3.5–5.2)
Alkaline Phosphatase: 50 U/L (ref 39–117)
BUN: 11 mg/dL (ref 6–23)
CO2: 28 mEq/L (ref 19–32)
Calcium: 9 mg/dL (ref 8.4–10.5)
Chloride: 104 mEq/L (ref 96–112)
Creatinine, Ser: 0.67 mg/dL (ref 0.40–1.20)
GFR: 91.85 mL/min (ref >60.00)
Glucose, Bld: 82 mg/dL (ref 70–99)
Potassium: 4.2 mEq/L (ref 3.5–5.1)
Sodium: 140 mEq/L (ref 135–145)
Total Bilirubin: 0.4 mg/dL (ref 0.2–1.2)
Total Protein: 6.8 g/dL (ref 6.0–8.3)

## 2018-11-19 LAB — CBC WITH DIFFERENTIAL/PLATELET
Basophils Absolute: 0 10*3/uL (ref 0.0–0.1)
Basophils Relative: 0.7 % (ref 0.0–3.0)
Eosinophils Absolute: 0.1 10*3/uL (ref 0.0–0.7)
Eosinophils Relative: 1.1 % (ref 0.0–5.0)
HCT: 38.3 % (ref 36.0–46.0)
Hemoglobin: 12.9 g/dL (ref 12.0–15.0)
Lymphocytes Relative: 38 % (ref 12.0–46.0)
Lymphs Abs: 2 10*3/uL (ref 0.7–4.0)
MCHC: 33.7 g/dL (ref 30.0–36.0)
MCV: 93.3 fl (ref 78.0–100.0)
Monocytes Absolute: 0.3 10*3/uL (ref 0.1–1.0)
Monocytes Relative: 5.8 % (ref 3.0–12.0)
Neutro Abs: 2.9 10*3/uL (ref 1.4–7.7)
Neutrophils Relative %: 54.4 % (ref 43.0–77.0)
Platelets: 316 10*3/uL (ref 150.0–400.0)
RBC: 4.1 Mil/uL (ref 3.87–5.11)
RDW: 13.8 % (ref 11.5–15.5)
WBC: 5.3 10*3/uL (ref 4.0–10.5)

## 2018-11-19 LAB — TSH: TSH: 0.79 u[IU]/mL (ref 0.35–4.50)

## 2018-11-19 LAB — T4, FREE: Free T4: 0.71 ng/dL (ref 0.60–1.60)

## 2018-11-19 LAB — VITAMIN D 25 HYDROXY (VIT D DEFICIENCY, FRACTURES): VITD: 26.45 ng/mL — ABNORMAL LOW (ref 30.00–100.00)

## 2018-11-19 NOTE — Progress Notes (Signed)
Hannah Vincent is a 53 y.o. female is here to [name].   Patient Care Team: Helane Rima, DO as PCP - General (Family Medicine) Willis Modena, MD as Consulting Physician (Gastroenterology) Donzetta Starch, MD as Consulting Physician (Dermatology) Silverio Lay, MD as Consulting Physician (Obstetrics and Gynecology) Zenovia Jordan, MD as Consulting Physician (Rheumatology)   History of Present Illness:   HPI:  1. Rheumatoid arthritis, involving unspecified site, unspecified rheumatoid factor presence (HCC) Followed by Rheumatology. Generally controlled.  2. Neck pain, chronic Decreased ROM. Head forward position.   3. Wakes up during night Multiple times. Daytime fatigue.  4. Oligomenorrhea Wonders if perimenopausal.  Health Maintenance Due  Topic Date Due  . HIV Screening  04/04/1980  . TETANUS/TDAP  04/04/1984  . PAP SMEAR-Modifier  04/04/1986   Depression screen PHQ 2/9 11/19/2018  Decreased Interest 0  Down, Depressed, Hopeless 0  PHQ - 2 Score 0  Altered sleeping 2  Tired, decreased energy 1  Change in appetite 0  Feeling bad or failure about yourself  0  Trouble concentrating 0  Moving slowly or fidgety/restless 0  Suicidal thoughts 0  PHQ-9 Score 3  Difficult doing work/chores Not difficult at all   PMHx, SurgHx, SocialHx, Medications, and Allergies were reviewed in the Visit Navigator and updated as appropriate.   Past Medical History:  Diagnosis Date  . Family history of colon cancer   . LBBB (left bundle branch block)   . RA (rheumatoid arthritis) (HCC)      Past Surgical History:  Procedure Laterality Date  . DILATION AND CURETTAGE OF UTERUS  1995   PARTIAL MOLE  . TONSILLECTOMY       Family History  Problem Relation Age of Onset  . Colon cancer Maternal Grandfather 40  . Birth defects Mother   . Hypertension Mother   . Heart disease Father    Social History   Tobacco Use  . Smoking status: Never Smoker  . Smokeless  tobacco: Never Used  Substance Use Topics  . Alcohol use: No  . Drug use: No   Current Medications and Allergies   .  Calcium Carb-Cholecalciferol (CALCIUM + VITAMIN D3) 500-400 MG-UNIT CHEW, Calcium 500 + D, Disp: , Rfl:  .  folic acid (FOLVITE) 1 MG tablet, Take 1 mg by mouth daily., Disp: , Rfl:  .  meloxicam (MOBIC) 15 MG tablet, Take 15 mg by mouth as needed. , Disp: , Rfl:  .  methotrexate (RHEUMATREX) 2.5 MG tablet, Take 2.5 mg by mouth once a week. Caution:Chemotherapy. Protect from light., Disp: , Rfl:  .  Omega-3 Fatty Acids (FISH OIL) 1000 MG CAPS, Fish Oil, Disp: , Rfl:    Allergies  Allergen Reactions  . Other    Review of Systems   Pertinent items are noted in the HPI. Otherwise, a complete ROS is negative.  Vitals   Vitals:   11/19/18 1338  BP: 105/70  Pulse: 69  Temp: 99.1 F (37.3 C)  TempSrc: Oral  SpO2: 97%  Weight: 137 lb 6.1 oz (62.3 kg)  Height: 5' 4.75" (1.645 m)     Body mass index is 23.04 kg/m.  Physical Exam   Physical Exam Vitals signs and nursing note reviewed.  HENT:     Head: Normocephalic and atraumatic.  Eyes:     Pupils: Pupils are equal, round, and reactive to light.  Neck:     Musculoskeletal: Normal range of motion and neck supple.     Thyroid: Thyromegaly present.  Cardiovascular:     Rate and Rhythm: Normal rate and regular rhythm.     Heart sounds: Normal heart sounds.  Pulmonary:     Effort: Pulmonary effort is normal.  Abdominal:     Palpations: Abdomen is soft.  Skin:    General: Skin is warm.  Psychiatric:        Behavior: Behavior normal.     Assessment and Plan   Hannah Vincent was seen today for establish care.  Diagnoses and all orders for this visit:  Rheumatoid arthritis, involving unspecified site, unspecified rheumatoid factor presence (HCC)  Neck pain, chronic -     CBC with Differential/Platelet -     Comprehensive metabolic panel -     Cancel: DG Cervical Spine 2 or 3 views -     DG Cervical Spine  Complete  Wakes up during night  Goiter -     TSH -     Thyroid Peroxidase Antibodies (TPO) (REFL) -     T4, free  Vitamin D deficiency -     VITAMIN D 25 Hydroxy (Vit-D Deficiency, Fractures)  Oligomenorrhea, unspecified type -     CBC with Differential/Platelet -     Comprehensive metabolic panel -     FSH/LH   . Orders and follow up as documented in [place], reviewed diet, exercise and weight control, cardiovascular risk and specific lipid/LDL goals reviewed, reviewed medications and side effects in detail.  . Reviewed expectations re: course of current medical issues. . Outlined signs and symptoms indicating need for more acute intervention. . Patient verbalized understanding and all questions were answered. . Patient received an After Visit Summary.  Briscoe Deutscher, DO [place], [facility] 12/14/2018

## 2018-11-20 LAB — FSH/LH
FSH: 34 m[IU]/mL
LH: 12.5 m[IU]/mL

## 2018-11-20 LAB — THYROID PEROXIDASE ANTIBODIES (TPO) (REFL): Thyroperoxidase Ab SerPl-aCnc: 2 IU/mL (ref <9)

## 2018-11-23 ENCOUNTER — Encounter: Payer: Self-pay | Admitting: Family Medicine

## 2018-12-11 ENCOUNTER — Inpatient Hospital Stay: Admission: RE | Admit: 2018-12-11 | Payer: BLUE CROSS/BLUE SHIELD | Source: Ambulatory Visit

## 2018-12-29 DIAGNOSIS — M2550 Pain in unspecified joint: Secondary | ICD-10-CM | POA: Diagnosis not present

## 2018-12-29 DIAGNOSIS — Z79899 Other long term (current) drug therapy: Secondary | ICD-10-CM | POA: Diagnosis not present

## 2018-12-29 DIAGNOSIS — M150 Primary generalized (osteo)arthritis: Secondary | ICD-10-CM | POA: Diagnosis not present

## 2018-12-29 DIAGNOSIS — M0589 Other rheumatoid arthritis with rheumatoid factor of multiple sites: Secondary | ICD-10-CM | POA: Diagnosis not present

## 2019-01-28 ENCOUNTER — Telehealth: Payer: Self-pay | Admitting: Physical Therapy

## 2019-01-28 NOTE — Telephone Encounter (Signed)
Copied from [place] [id number]. Topic: General - Inquiry >> Jan 28, 2019  4:28 PM Virl Axe D wrote: Reason for CRM: Pt needs labs from 11/19/18 sent to her OBGYN. [facility], Dr. Katharine Look Rivard. She has an appt on 02/03/19. Please advise.

## 2019-01-29 NOTE — Telephone Encounter (Signed)
Called and let patient know orders faxed

## 2019-02-03 DIAGNOSIS — E559 Vitamin D deficiency, unspecified: Secondary | ICD-10-CM | POA: Diagnosis not present

## 2019-02-03 DIAGNOSIS — N951 Menopausal and female climacteric states: Secondary | ICD-10-CM | POA: Diagnosis not present

## 2019-02-11 ENCOUNTER — Telehealth: Payer: Self-pay | Admitting: Family Medicine

## 2019-02-11 NOTE — Telephone Encounter (Signed)
See note  Copied from [place] [id number]. Topic: General - Other >> Feb 11, 2019  3:30 PM Sheran Luz wrote: Patient requesting call back from [facility] to discuss cortisone injection as previously recommended at last OV with Dr. Juleen China.

## 2019-02-12 NOTE — Telephone Encounter (Signed)
Left message to return call to our office.  

## 2019-02-13 NOTE — Telephone Encounter (Signed)
I spoke with patient and she informed me that she would like a referral for the arthritis in her neck.  I told pt that I would inform Dr. Juleen China and someone will contact her from office that she was referred to.  This was from pt's visit on 11/19/18.  Please advise

## 2019-02-13 NOTE — Telephone Encounter (Signed)
Please send referral for PMR for cervical ESI.

## 2019-02-13 NOTE — Telephone Encounter (Signed)
Pt calling back. Please call [phone]

## 2019-02-16 ENCOUNTER — Other Ambulatory Visit: Payer: Self-pay

## 2019-02-16 DIAGNOSIS — M5412 Radiculopathy, cervical region: Secondary | ICD-10-CM

## 2019-02-16 DIAGNOSIS — M069 Rheumatoid arthritis, unspecified: Secondary | ICD-10-CM

## 2019-02-16 NOTE — Addendum Note (Signed)
Addended by: Darral Dash on: 02/16/2019 09:17 AM   Modules accepted: Orders

## 2019-02-16 NOTE — Telephone Encounter (Signed)
Order placed

## 2019-02-22 NOTE — Progress Notes (Signed)
Hannah Vincent is a 53 y.o. female is here for follow up.  History of Present Illness:   [name], RMA, acting as scribe for Dr. Helane Rima.   HPI: Patient here for 3 month follow up visit.  Patient would like to discuss hormone medication, which she has not started any of medication prescribed yet. Patient also would like to discuss neck x-ray's performed in July 2020.  Health Maintenance Due  Topic Date Due  . HIV Screening  04/04/1980  . TETANUS/TDAP  04/04/1984   Depression screen PHQ 2/9 02/23/2019 11/19/2018  Decreased Interest 0 0  Down, Depressed, Hopeless 1 0  PHQ - 2 Score 1 0  Altered sleeping 2 2  Tired, decreased energy 1 1  Change in appetite 1 0  Feeling bad or failure about yourself  1 0  Trouble concentrating 0 0  Moving slowly or fidgety/restless 0 0  Suicidal thoughts 0 0  PHQ-9 Score 6 3  Difficult doing work/chores Somewhat difficult Not difficult at all   PMHx, SurgHx, SocialHx, FamHx, Medications, and Allergies were reviewed in the Visit Navigator and updated as appropriate.   Patient Active Problem List   Diagnosis Date Noted  . Osteoarthritis of spine with radiculopathy, cervical region 02/23/2019  . Menopausal state 02/23/2019  . Rheumatoid arthritis (HCC)   . Family history of colon cancer    Social History   Tobacco Use  . Smoking status: Never Smoker  . Smokeless tobacco: Never Used  Substance Use Topics  . Alcohol use: No  . Drug use: No   Current Medications and Allergies   Current Outpatient Medications:  .  Calcium Carb-Cholecalciferol (CALCIUM + VITAMIN D3) 500-400 MG-UNIT CHEW, Calcium 500 + D, Disp: , Rfl:  .  D3-50 1.25 MG (50000 UT) capsule, TAKE 1 CAPSULE BY MOUTH ONE TIME PER WEEK, Disp: , Rfl:  .  folic acid (FOLVITE) 1 MG tablet, Take 1 mg by mouth daily., Disp: , Rfl:  .  meloxicam (MOBIC) 15 MG tablet, Take 15 mg by mouth as needed. , Disp: , Rfl:  .  methotrexate (RHEUMATREX) 2.5 MG tablet, Take 2.5 mg by mouth  once a week. Caution:Chemotherapy. Protect from light., Disp: , Rfl:  .  Omega-3 Fatty Acids (FISH OIL) 1000 MG CAPS, Fish Oil, Disp: , Rfl:  .  estradiol (CLIMARA - DOSED IN MG/24 HR) 0.05 mg/24hr patch, APPLY 1 PATCH EVERY WEEK, Disp: , Rfl:  .  progesterone (PROMETRIUM) 100 MG capsule, TAKE 1 CAPSULE(S) EVERY DAY BY ORAL ROUTE FOR 12 DAYS., Disp: , Rfl:    Allergies  Allergen Reactions  . Other    Review of Systems   Pertinent items are noted in the HPI. Otherwise, a complete ROS is negative.  Vitals   Vitals:   02/23/19 0941  BP: 106/70  Pulse: 74  Temp: 98.5 F (36.9 C)  TempSrc: Temporal  SpO2: 97%  Weight: 139 lb 12.8 oz (63.4 kg)  Height: 5' 4.75" (1.645 m)     Body mass index is 23.44 kg/m.  Physical Exam   Physical Exam Vitals signs and nursing note reviewed.  HENT:     Head: Normocephalic and atraumatic.  Eyes:     Pupils: Pupils are equal, round, and reactive to light.  Neck:     Musculoskeletal: Normal range of motion and neck supple.  Cardiovascular:     Rate and Rhythm: Normal rate and regular rhythm.     Heart sounds: Normal heart sounds.  Pulmonary:  Effort: Pulmonary effort is normal.  Abdominal:     Palpations: Abdomen is soft.  Skin:    General: Skin is warm.  Psychiatric:        Behavior: Behavior normal.    Results for orders placed or performed in visit on 11/19/18  CBC with Differential/Platelet  Result Value Ref Range   WBC 5.3 4.0 - 10.5 K/uL   RBC 4.10 3.87 - 5.11 Mil/uL   Hemoglobin 12.9 12.0 - 15.0 g/dL   HCT 38.3 36.0 - 46.0 %   MCV 93.3 78.0 - 100.0 fl   MCHC 33.7 30.0 - 36.0 g/dL   RDW 13.8 11.5 - 15.5 %   Platelets 316.0 150.0 - 400.0 K/uL   Neutrophils Relative % 54.4 43.0 - 77.0 %   Lymphocytes Relative 38.0 12.0 - 46.0 %   Monocytes Relative 5.8 3.0 - 12.0 %   Eosinophils Relative 1.1 0.0 - 5.0 %   Basophils Relative 0.7 0.0 - 3.0 %   Neutro Abs 2.9 1.4 - 7.7 K/uL   Lymphs Abs 2.0 0.7 - 4.0 K/uL   Monocytes  Absolute 0.3 0.1 - 1.0 K/uL   Eosinophils Absolute 0.1 0.0 - 0.7 K/uL   Basophils Absolute 0.0 0.0 - 0.1 K/uL  Comprehensive metabolic panel  Result Value Ref Range   Sodium 140 135 - 145 mEq/L   Potassium 4.2 3.5 - 5.1 mEq/L   Chloride 104 96 - 112 mEq/L   CO2 28 19 - 32 mEq/L   Glucose, Bld 82 70 - 99 mg/dL   BUN 11 6 - 23 mg/dL   Creatinine, Ser 0.67 0.40 - 1.20 mg/dL   Total Bilirubin 0.4 0.2 - 1.2 mg/dL   Alkaline Phosphatase 50 39 - 117 U/L   AST 15 0 - 37 U/L   ALT 14 0 - 35 U/L   Total Protein 6.8 6.0 - 8.3 g/dL   Albumin 4.5 3.5 - 5.2 g/dL   Calcium 9.0 8.4 - 10.5 mg/dL   GFR 91.85 >60.00 mL/min  VITAMIN D 25 Hydroxy (Vit-D Deficiency, Fractures)  Result Value Ref Range   VITD 26.45 (L) 30.00 - 100.00 ng/mL  TSH  Result Value Ref Range   TSH 0.79 0.35 - 4.50 uIU/mL  Thyroid Peroxidase Antibodies (TPO) (REFL)  Result Value Ref Range   Thyroperoxidase Ab SerPl-aCnc 2 <9 IU/mL  T4, free  Result Value Ref Range   Free T4 0.71 0.60 - 1.60 ng/dL  FSH/LH  Result Value Ref Range   FSH 34.0 mIU/mL   LH 12.5 mIU/mL   Assessment and Plan   Cira was seen today for follow-up.  Diagnoses and all orders for this visit:  Rheumatoid arthritis, involving unspecified site, unspecified whether rheumatoid factor present ([place])  Need for immunization against influenza -     Flu Vaccine QUAD 36+ mos IM  Menopausal state Comments: Encouraged patient to start HRT.  Osteoarthritis of spine with radiculopathy, cervical region Comments: Discussed safe use of NSAIDs. Discussed PMR for ESI.   . Orders and follow up as documented in [place], reviewed diet, exercise and weight control, cardiovascular risk and specific lipid/LDL goals reviewed, reviewed medications and side effects in detail.  . Reviewed expectations re: course of current medical issues. . Outlined signs and symptoms indicating need for more acute intervention. . Patient verbalized understanding and all questions  were answered. . Patient received an After Visit Summary.  CMA served as Education administrator during this visit. History, Physical, and Plan performed by medical provider. The above  documentation has been reviewed and is accurate and complete. Helane Rima, D.O.  Helane Rima, DO [place], [facility] [place] 02/23/2019

## 2019-02-23 ENCOUNTER — Encounter: Payer: Self-pay | Admitting: Family Medicine

## 2019-02-23 ENCOUNTER — Ambulatory Visit (INDEPENDENT_AMBULATORY_CARE_PROVIDER_SITE_OTHER): Payer: BC Managed Care – PPO | Admitting: Family Medicine

## 2019-02-23 ENCOUNTER — Other Ambulatory Visit: Payer: Self-pay

## 2019-02-23 VITALS — BP 106/70 | HR 74 | Temp 98.5°F | Ht 64.75 in | Wt 139.8 lb

## 2019-02-23 DIAGNOSIS — M069 Rheumatoid arthritis, unspecified: Secondary | ICD-10-CM

## 2019-02-23 DIAGNOSIS — N951 Menopausal and female climacteric states: Secondary | ICD-10-CM

## 2019-02-23 DIAGNOSIS — M4722 Other spondylosis with radiculopathy, cervical region: Secondary | ICD-10-CM | POA: Insufficient documentation

## 2019-02-23 DIAGNOSIS — Z23 Encounter for immunization: Secondary | ICD-10-CM | POA: Diagnosis not present

## 2019-02-23 NOTE — Patient Instructions (Addendum)
Health Maintenance Due  Topic Date Due  . HIV Screening  04/04/1980  . TETANUS/TDAP Pt unaware of how long it has been. 04/04/1984    Depression screen PHQ 2/9 02/23/2019 11/19/2018  Decreased Interest 0 0  Down, Depressed, Hopeless 1 0  PHQ - 2 Score 1 0  Altered sleeping 2 2  Tired, decreased energy 1 1  Change in appetite 1 0  Feeling bad or failure about yourself  1 0  Trouble concentrating 0 0  Moving slowly or fidgety/restless 0 0  Suicidal thoughts 0 0  PHQ-9 Score 6 3  Difficult doing work/chores Somewhat difficult Not difficult at all   We have several Provider options: Female doctors Dr. Rogers Blocker Dr. Jonni Sanger  Female PA Inda Coke, [place] Female Doctor: Dr. Jerline Pain Any are good options just let our office know and we will change your upcomming appointment with one of them.    DO in options at other  [facility] offices   [place]  Dr. Letta Median Dr. Luetta Nutting   [facility] Dr. Howard Pouch  [place]  Dr. Riki Sheer

## 2019-03-10 ENCOUNTER — Other Ambulatory Visit: Payer: Self-pay

## 2019-03-10 ENCOUNTER — Encounter: Payer: Self-pay | Admitting: Physical Medicine and Rehabilitation

## 2019-03-10 ENCOUNTER — Ambulatory Visit (INDEPENDENT_AMBULATORY_CARE_PROVIDER_SITE_OTHER): Payer: BC Managed Care – PPO | Admitting: Physical Medicine and Rehabilitation

## 2019-03-10 VITALS — BP 117/77 | HR 67

## 2019-03-10 DIAGNOSIS — M4712 Other spondylosis with myelopathy, cervical region: Secondary | ICD-10-CM

## 2019-03-10 DIAGNOSIS — M7918 Myalgia, other site: Secondary | ICD-10-CM

## 2019-03-10 DIAGNOSIS — M4302 Spondylolysis, cervical region: Secondary | ICD-10-CM

## 2019-03-10 NOTE — Progress Notes (Signed)
  Numeric Pain Rating Scale and Functional Assessment Average Pain 7 Pain Right Now 2 My pain is intermittent and dull Pain is worse with: some activites Pain improves with: medication   In the last MONTH (on 0-10 scale) has pain interfered with the following?  1. General activity like being  able to carry out your everyday physical activities such as walking, climbing stairs, carrying groceries, or moving a chair?  Rating(4)  2. Relation with others like being able to carry out your usual social activities and roles such as  activities at home, at work and in your community. Rating(4)  3. Enjoyment of life such that you have  been bothered by emotional problems such as feeling anxious, depressed or irritable?  Rating(6)

## 2019-03-25 DIAGNOSIS — M0589 Other rheumatoid arthritis with rheumatoid factor of multiple sites: Secondary | ICD-10-CM | POA: Diagnosis not present

## 2019-03-25 DIAGNOSIS — M150 Primary generalized (osteo)arthritis: Secondary | ICD-10-CM | POA: Diagnosis not present

## 2019-03-25 DIAGNOSIS — M2550 Pain in unspecified joint: Secondary | ICD-10-CM | POA: Diagnosis not present

## 2019-03-25 DIAGNOSIS — Z79899 Other long term (current) drug therapy: Secondary | ICD-10-CM | POA: Diagnosis not present

## 2019-04-01 DIAGNOSIS — M542 Cervicalgia: Secondary | ICD-10-CM | POA: Diagnosis not present

## 2019-04-06 DIAGNOSIS — M542 Cervicalgia: Secondary | ICD-10-CM | POA: Diagnosis not present

## 2019-04-08 ENCOUNTER — Ambulatory Visit: Payer: BC Managed Care – PPO | Admitting: Physical Medicine and Rehabilitation

## 2019-04-09 ENCOUNTER — Encounter: Payer: Self-pay | Admitting: Physical Medicine and Rehabilitation

## 2019-04-09 NOTE — Progress Notes (Signed)
Hannah Vincent - 53 y.o. female MRN [id number]  Date of birth: 1966/04/13  Office Visit Note: Visit Date: 03/10/2019 PCP: Briscoe Deutscher, DO Referred by: Briscoe Deutscher, DO  Subjective: Chief Complaint  Patient presents with  . Neck - Pain   HPI: Hannah Vincent is a 53 y.o. female who comes in today New patient consultation at the request of Dr. Briscoe Deutscher for chronic worsening axial neck pain worse after running.  Patient reports a history of being an active runner and tries to exercise.  She has a diagnosis of rheumatoid arthritis mainly followed by Dr. Juleen China.  She reports that in July or so maybe 5 months ago she began having these worsening pains about the C7 spinous process that would occur at night on the days that she ran.  She would also get headaches at times mostly in the occiput but it could be bandlike pain.  This would not occur much while she was running but would occur after she ran.  She denies any specific tingling or numbness in the arms no radicular pain down the arms.  She says her pain is intermittent and dull it can be on average 7 out of 10 but today it is not doing too bad.  She does take some anti-inflammatory medication and medication for the rheumatoid arthritis.  She has not had specific physical therapy or chiropractic care.  X-rays of the cervical spine were completed by Dr. Juleen China and these are reviewed below.  She has not had MRI of the cervical spine or any other advanced imaging.  No prior cervical surgery.  Cervical x-rays mainly degenerative changes at C5-6 which is actually fairly common especially as we get into middle-age.  We did show her the images as well as spine models.  Review of Systems  Constitutional: Negative for chills, fever, malaise/fatigue and weight loss.  HENT: Negative for hearing loss and sinus pain.   Eyes: Negative for blurred vision, double vision and photophobia.  Respiratory: Negative for cough and shortness of breath.    Cardiovascular: Negative for chest pain, palpitations and leg swelling.  Gastrointestinal: Negative for abdominal pain, nausea and vomiting.  Genitourinary: Negative for flank pain.  Musculoskeletal: Positive for neck pain. Negative for myalgias.  Skin: Negative for itching and rash.  Neurological: Positive for headaches. Negative for tremors, focal weakness and weakness.  Endo/Heme/Allergies: Negative.   Psychiatric/Behavioral: Negative for depression.  All other systems reviewed and are negative.  Otherwise per HPI.  Assessment & Plan: Visit Diagnoses:  1. Myofascial pain   2. Spondylolysis, cervical region   3. Other spondylosis with myelopathy, cervical region     Plan: Findings:  Chronic several months of upper neck pain and associated headache after running.  Cervical spine with degenerative facet arthropathy also with disc height loss at C5-6 which is fairly common as we get older.  We discussed myofascial pain syndrome with trigger points we also discussed potential for cervical narrowing although I do not think she is really having symptoms of that at this point.  We discussed that running is fine as it seems to be okay while she runs that I think it is more of a mechanical issue with strengthening of the cervical spine as well as trigger point issues at this point.  No sign of radicular pain or other issues.  She is reassured about the exam and the imaging.  If she is stagnant with physical therapy which we will send her for for treatment for  strengthening and trigger point manipulation as well as mechanical work, and I would likely look at MRI of the cervical spine at least 1 time.  No change in medication at this point but could also look at specific muscle relaxer or other medication for musculoskeletal pain such as duloxetine.  She is already taking meloxicam and methotrexate.    Meds & Orders: No orders of the defined types were placed in this encounter.   Orders Placed This  Encounter  Procedures  . Ambulatory referral to Physical Therapy    Follow-up: Return if symptoms worsen or fail to improve.   Procedures: No procedures performed  No notes on file   Clinical History: EXAM: CERVICAL SPINE - COMPLETE 4+ VIEW  COMPARISON:  06/05/2017  FINDINGS: Disc space narrowing and spurring at C5-6, stable. Mild left neural foraminal narrowing at C5-6 and C3-4 due to uncovertebral spurring and facet disease. No fracture. Normal alignment. Prevertebral soft tissues are normal.  IMPRESSION: Degenerative changes as above. Mild left neural foraminal narrowing at C3-4 and C5-6. No acute bony abnormality.   Electronically Signed   By: Charlett NoseKevin  Dover M.D.   On: 11/19/2018 20:39   She reports that she has never smoked. She has never used smokeless tobacco. No results for input(s): HGBA1C, LABURIC in the last 8760 hours.  Objective:  VS:  HT:    WT:   BMI:     BP:117/77  HR:67bpm  TEMP: ( )  RESP:  Physical Exam Vitals signs and nursing note reviewed.  Constitutional:      General: She is not in acute distress.    Appearance: Normal appearance. She is well-developed.  HENT:     Head: Normocephalic and atraumatic.     Nose: Nose normal.     Mouth/Throat:     Mouth: Mucous membranes are moist.     Pharynx: Oropharynx is clear.  Eyes:     Conjunctiva/sclera: Conjunctivae normal.     Pupils: Pupils are equal, round, and reactive to light.  Neck:     Musculoskeletal: Neck supple. Muscular tenderness present. No neck rigidity.     Comments: Forward flexed cervical spine Cardiovascular:     Rate and Rhythm: Regular rhythm.  Pulmonary:     Effort: Pulmonary effort is normal. No respiratory distress.  Abdominal:     General: There is no distension.     Palpations: Abdomen is soft.     Tenderness: There is no guarding.  Musculoskeletal:     Right lower leg: No edema.     Left lower leg: No edema.     Comments: Patient sits with forward flexed  cervical spine.  She does have pain about the C7 spinous process with deep palpation there are active trigger points in the levator scapula trapezius and cervical paraspinal multifidus I or what would be the longissimus.  She has no shoulder impingement.  She does have forward flexed cervical spine with pain somewhat on extension and forward flexion she has decent range of motion left and right some pain with more left rotation.  She has good strength in the upper arms without deficit.  She has a negative Hoffmann's.  Negative Spurling's.  Lymphadenopathy:     Cervical: No cervical adenopathy.  Skin:    General: Skin is warm and dry.     Findings: No erythema or rash.  Neurological:     General: No focal deficit present.     Mental Status: She is alert and oriented to person, place, and  time.     Motor: No abnormal muscle tone.     Coordination: Coordination normal.     Gait: Gait normal.  Psychiatric:        Mood and Affect: Mood normal.        Behavior: Behavior normal.        Thought Content: Thought content normal.     Ortho Exam Imaging: No results found.  Past Medical/Family/Surgical/Social History: Medications & Allergies reviewed per EMR, new medications updated. Patient Active Problem List   Diagnosis Date Noted  . Osteoarthritis of spine with radiculopathy, cervical region 02/23/2019  . Menopausal state 02/23/2019  . Rheumatoid arthritis (HCC)   . Family history of colon cancer    Past Medical History:  Diagnosis Date  . Family history of colon cancer   . LBBB (left bundle branch block)   . RA (rheumatoid arthritis) (HCC)    Family History  Problem Relation Age of Onset  . Colon cancer Maternal Grandfather 40  . Birth defects Mother   . Hypertension Mother   . Heart disease Father    Past Surgical History:  Procedure Laterality Date  . DILATION AND CURETTAGE OF UTERUS  1995   PARTIAL MOLE  . TONSILLECTOMY     Social History   Occupational History  . Not  on file  Tobacco Use  . Smoking status: Never Smoker  . Smokeless tobacco: Never Used  Substance and Sexual Activity  . Alcohol use: No  . Drug use: No  . Sexual activity: Yes    Birth control/protection: Rhythm

## 2019-04-13 DIAGNOSIS — M542 Cervicalgia: Secondary | ICD-10-CM | POA: Diagnosis not present

## 2019-04-21 DIAGNOSIS — M542 Cervicalgia: Secondary | ICD-10-CM | POA: Diagnosis not present

## 2019-06-25 DIAGNOSIS — M0589 Other rheumatoid arthritis with rheumatoid factor of multiple sites: Secondary | ICD-10-CM | POA: Diagnosis not present

## 2019-07-13 ENCOUNTER — Telehealth: Payer: Self-pay | Admitting: Family Medicine

## 2019-07-13 ENCOUNTER — Other Ambulatory Visit: Payer: Self-pay

## 2019-07-13 ENCOUNTER — Encounter: Payer: Self-pay | Admitting: Family Medicine

## 2019-07-13 ENCOUNTER — Ambulatory Visit (INDEPENDENT_AMBULATORY_CARE_PROVIDER_SITE_OTHER): Payer: BLUE CROSS/BLUE SHIELD | Admitting: Family Medicine

## 2019-07-13 VITALS — BP 120/78 | HR 66 | Temp 98.0°F | Ht 64.75 in | Wt 143.0 lb

## 2019-07-13 DIAGNOSIS — R200 Anesthesia of skin: Secondary | ICD-10-CM

## 2019-07-13 DIAGNOSIS — M7121 Synovial cyst of popliteal space [Baker], right knee: Secondary | ICD-10-CM

## 2019-07-13 DIAGNOSIS — R202 Paresthesia of skin: Secondary | ICD-10-CM

## 2019-07-13 DIAGNOSIS — M79604 Pain in right leg: Secondary | ICD-10-CM | POA: Diagnosis not present

## 2019-07-13 LAB — CBC WITH DIFFERENTIAL/PLATELET
Basophils Absolute: 0 10*3/uL (ref 0.0–0.1)
Basophils Relative: 0.8 % (ref 0.0–3.0)
Eosinophils Absolute: 0.1 10*3/uL (ref 0.0–0.7)
Eosinophils Relative: 1.1 % (ref 0.0–5.0)
HCT: 39.7 % (ref 36.0–46.0)
Hemoglobin: 13.4 g/dL (ref 12.0–15.0)
Lymphocytes Relative: 35.5 % (ref 12.0–46.0)
Lymphs Abs: 2.1 10*3/uL (ref 0.7–4.0)
MCHC: 33.8 g/dL (ref 30.0–36.0)
MCV: 95.3 fl (ref 78.0–100.0)
Monocytes Absolute: 0.3 10*3/uL (ref 0.1–1.0)
Monocytes Relative: 5.5 % (ref 3.0–12.0)
Neutro Abs: 3.3 10*3/uL (ref 1.4–7.7)
Neutrophils Relative %: 57.1 % (ref 43.0–77.0)
Platelets: 290 10*3/uL (ref 150.0–400.0)
RBC: 4.17 Mil/uL (ref 3.87–5.11)
RDW: 13.4 % (ref 11.5–15.5)
WBC: 5.8 10*3/uL (ref 4.0–10.5)

## 2019-07-13 LAB — VITAMIN B12: Vitamin B-12: 245 pg/mL (ref 211–911)

## 2019-07-13 LAB — D-DIMER, QUANTITATIVE: D-Dimer, Quant: <0.19 mcg/mL FEU (ref <0.50)

## 2019-07-13 LAB — TSH: TSH: 0.99 u[IU]/mL (ref 0.35–4.50)

## 2019-07-13 NOTE — Patient Instructions (Signed)
Get voltaren gel over the counter. Put on four times a day. A knee brace would be helpful too. Referral put in for sports medicine as well.   Baker Cyst  A Baker cyst, also called a popliteal cyst, is a growth that forms at the back of the knee. The cyst forms when the fluid-filled sac (bursa) that cushions the knee joint becomes enlarged. What are the causes? In most cases, a Baker cyst results from another knee problem that causes swelling inside the knee. This makes the fluid inside the knee joint (synovial fluid) flow into the bursa behind the knee, causing the bursa to enlarge. What increases the risk? You may be more likely to develop a Baker cyst if you already have a knee problem, such as:  A tear in cartilage that cushions the knee joint (meniscal tear).  A tear in the tissues that connect the bones of the knee joint (ligament tear).  Knee swelling from osteoarthritis, rheumatoid arthritis, or gout. What are the signs or symptoms? The main symptom of this condition is a lump behind the knee. This may be the only symptom of the condition. The lump may be painful, especially when the knee is straightened. If the lump is painful, the pain may come and go. The knee may also be stiff. Symptoms may quickly get more severe if the cyst breaks open (ruptures). If the cyst ruptures, you may feel the following in your knee and calf:  Sudden or worsening pain.  Swelling.  Bruising.  Redness in the calf. A Baker cyst does not always cause symptoms. How is this diagnosed? This condition may be diagnosed based on your symptoms and medical history. Your health care provider will also do a physical exam. This may include:  Feeling the cyst to check whether it is tender.  Checking your knee for signs of another knee condition that causes swelling. You may have imaging tests, such as:  X-rays.  MRI.  Ultrasound. How is this treated? A Baker cyst that is not painful may go away without  treatment. If the cyst gets large or painful, it will likely get better if the underlying knee problem is treated. If needed, treatment for a Baker cyst may include:  Resting.  Keeping weight off of the knee. This means not leaning on the knee to support your body weight.  Taking NSAIDs, such as ibuprofen, to reduce pain and swelling.  Having a procedure to drain the fluid from the cyst with a needle (aspiration). You may also get an injection of a medicine that reduces swelling (steroid).  Having surgery. This may be needed if other treatments do not work. This usually involves correcting knee damage and removing the cyst. Follow these instructions at home:  Activity  Rest as told by your health care provider.  Avoid activities that make pain or swelling worse.  Return to your normal activities as told by your health care provider. Ask your health care provider what activities are safe for you.  Do not use the injured limb to support your body weight until your health care provider says that you can. Use crutches as told by your health care provider. General instructions  Take over-the-counter and prescription medicines only as told by your health care provider.  Keep all follow-up visits as told by your health care provider. This is important. Contact a health care provider if:  You have knee pain, stiffness, or swelling that does not get better. Get help right away if:  You have sudden or worsening pain and swelling in your calf area. Summary  A Baker cyst, also called a popliteal cyst, is a growth that forms at the back of the knee.  In most cases, a Baker cyst results from another knee problem that causes swelling inside the knee.  A Baker cyst that is not painful may go away without treatment.  If needed, treatment for a Baker cyst may include resting, keeping weight off of the knee, medicines, or draining fluid from the cyst.  Surgery may be needed if other  treatments are not effective. This information is not intended to replace advice given to you by your health care provider. Make sure you discuss any questions you have with your health care provider. Document Revised: 09/19/2018 Document Reviewed: 09/19/2018 Elsevier Patient Education  2020 [employer].

## 2019-07-13 NOTE — Telephone Encounter (Signed)
Nurse: Cain Saupe, RN, Dena Date/Time (Eastern Time): 07/13/2019 9:00:30 AM Confirm and document reason for call. If symptomatic, describe symptoms. ---Caller states she has pain in right leg; thinks she has a blood clot. Reports pain started 1.5wk ago; pain above right knee, numbness in foot intermittent; OTC pain medications does provide some relief. Reports "maybe" some swelling, slightly warm behind upper thigh. Has the patient had close contact with a person known or suspected to have the novel coronavirus illness OR traveled / lives in area with major community spread (including international travel) in the last 14 days from the onset of symptoms? * If Asymptomatic, screen for exposure and travel within the last 14 days. ---No Does the patient have any new or worsening symptoms? ---Yes Will a triage be completed? ---Yes Related visit to physician within the last 2 weeks? ---No Does the PT have any chronic conditions? (i.e. diabetes, asthma, this includes High risk factors for pregnancy, etc.) ---Yes List chronic conditions. ---RA, hormone replacement therapy, Is the patient pregnant or possibly pregnant? (Ask all females between the ages of 76-55) ---No Is this a behavioral health or substance abuse call? ---No PLEASE NOTE: All timestamps contained within this report are represented as [place] Standard Time. CONFIDENTIALTY NOTICE: This fax transmission is intended only for the addressee. It contains information that is legally privileged, confidential or otherwise protected from use or disclosure. If you are not the intended recipient, you are strictly prohibited from reviewing, disclosing, copying using or disseminating any of this information or taking any action in reliance on or regarding this information. If you have received this fax in error, please notify us immediately by telephone so that we can arrange for its return to [place]. Phone: [phone], Toll-Free: [phone], Fax:  [phone] Page: 2 of 2 Call Id: [id number] Guidelines Guideline Title Affirmed Question Affirmed Notes Nurse Date/Time Lamount Cohen Time) Leg Pain [1] Thigh or calf pain AND [2] only 1 side AND [3] present > 1 hour (Exception: chronic unchanged pain) Cain Saupe, RN, Dena 07/13/2019 9:03:57 AM Disp. Time Lamount Cohen Time) Disposition Final User 07/13/2019 9:11:35 AM Called On-Call Provider Cain Saupe, RN, Dena Reason: Spoke with Carlena Sax on office backline; appt made for today at [facility] 07/13/2019 9:11:23 AM See HCP within 4 Hours (or PCP triage) Yes Cain Saupe, RN, Dena Caller Disagree/Comply Comply Caller Understands Yes PreDisposition Call Doctor Care Advice Given Per Guideline SEE HCP WITHIN 4 HOURS (OR PCP TRIAGE): * IF OFFICE WILL BE OPEN: You need to be seen within the next 3 or 4 hours. Call your doctor (or NP/PA) now or as soon as the office opens. CARE ADVICE given per Leg Pain (Adult) guideline.

## 2019-07-13 NOTE — Progress Notes (Deleted)
Patient: Hannah Vincent MRN: [id number] DOB: [birth year] PCP: Helane Rima, DO     Subjective:  Chief Complaint  Patient presents with  . Leg Pain  . Tingling    HPI: The patient is a 54 y.o. female who presents today for ***  Review of Systems  Allergies Patient is allergic to other.  Past Medical History Patient  has a past medical history of Family history of colon cancer, LBBB (left bundle branch block), and RA (rheumatoid arthritis) (HCC).  Surgical History Patient  has a past surgical history that includes Tonsillectomy and Dilation and curettage of uterus (1995).  Family History Pateint's family history includes Birth defects in her mother; Colon cancer (age of onset: 70) in her maternal grandfather; Heart disease in her father; Hypertension in her mother.  Social History Patient  reports that she has never smoked. She has never used smokeless tobacco. She reports that she does not drink alcohol or use drugs.    Objective: Vitals:   07/13/19 1107  BP: 120/78  Pulse: 66  Temp: 98 F (36.7 C)  TempSrc: Temporal  SpO2: 97%  Weight: 143 lb (64.9 kg)  Height: 5' 4.75" (1.645 m)    Body mass index is 23.98 kg/m.  Physical Exam     Assessment/plan:      No follow-ups on file.     Orland Mustard, MD [place] [facility]  07/13/2019

## 2019-07-13 NOTE — Progress Notes (Signed)
Patient: Hannah Vincent MRN: [id number] DOB: 1965/06/16 PCP: Briscoe Deutscher, DO     Subjective:  Chief Complaint  Patient presents with  . Leg Pain    right  . Tingling    HPI: The patient is a 54 y.o. female who presents today for right leg pain and tingling. Symptoms started about 1.5 weeks ago and started on the back up her upper thigh. Sometimes she will have achynes  in her calf and her foot. She also will have tingling in her right foot intermittently. She jogs every other day and states it doesn't really feel like a pulled muscle. She has been able to run without pain until yesterday. Doesn't seem to be associated with exercise. Pain is achyiness and can wake her up at night. Pain rated as a 6-7/10. She has been taking baby ASA and aleve. The aleve has worked well, but has progressively lost it's effectiveness. No leg swelling, no redness and no tenderness to touch the leg.   The tingling in her right foot is very rare. Maybe 2-3 times and doesn't last long.   She is worried about a blood clot. She is on HRT. She does not smoke. She has not had recent surgery, been immobile or been in hospital.  She is not pregnant and no known clotting/blood disorders.   Review of Systems  Constitutional: Negative for chills, fatigue and fever.  HENT: Negative for sinus pressure, sinus pain and sore throat.   Respiratory: Negative for cough, shortness of breath and wheezing.   Cardiovascular: Negative for chest pain and palpitations.  Gastrointestinal: Negative for abdominal pain, nausea and vomiting.  Genitourinary: Negative for frequency, pelvic pain and urgency.  Musculoskeletal: Negative for back pain, joint swelling and neck pain.       Pain in right leg  Neurological: Negative for dizziness, light-headedness and headaches.    Allergies Patient is allergic to other.  Past Medical History Patient  has a past medical history of Family history of colon cancer, LBBB (left bundle branch  block), and RA (rheumatoid arthritis) ([place]).  Surgical History Patient  has a past surgical history that includes Tonsillectomy and Dilation and curettage of uterus (1995).  Family History Pateint's family history includes Birth defects in her mother; Colon cancer (age of onset: 68) in her maternal grandfather; Heart disease in her father; Hypertension in her mother.  Social History Patient  reports that she has never smoked. She has never used smokeless tobacco. She reports that she does not drink alcohol or use drugs.    Objective: Vitals:   07/13/19 1107  BP: 120/78  Pulse: 66  Temp: 98 F (36.7 C)  TempSrc: Temporal  SpO2: 97%  Weight: 143 lb (64.9 kg)  Height: 5' 4.75" (1.645 m)    Body mass index is 23.98 kg/m.  Physical Exam Vitals reviewed.  Constitutional:      General: She is not in acute distress.    Appearance: Normal appearance. She is normal weight.  HENT:     Head: Normocephalic and atraumatic.  Pulmonary:     Effort: Pulmonary effort is normal.  Musculoskeletal:        General: Tenderness (TTP in right popliteal fossa. No edema, erythema in right proximal or distal leg. No pooling pedal edema, no superifical varicose veins. negative homan sign. ) present. Normal range of motion.     Comments: Can fully extend and flex her right knee to 180 degrees and past 90 degrees respectively. Fluid in popliteal fossa.  Neurological:     Mental Status: She is alert.        Well's score for DVT: -2  Assessment/plan: 1. Baker's cyst of knee, right Exam consistent with baker's cyst of right popliteal fossa and has risk of RA. Her probability for DVT is very low. Will order stat d-dimer, but discussed no imaging warranted at this time. Will also send to sports medicine to see if they can drain/inject. Recommended voltaren gel and knee brace. If any swelling, redness or increased pain in calf upper leg would go to ER. If d-dimer is elevated will get stat US, but  probability is quite low and discussed this with her.  - Ambulatory referral to Sports Medicine - D-dimer, quantitative (not at [facility])  2. Numbness and tingling of right lower extremity checking labs, but very intermittent and could be secondary to nerve irritation from fluid from baker's cyst.  - Vitamin B12 - TSH - CBC with Differential/Platelet - Comprehensive metabolic panel  3. Right leg pain See above.  - D-dimer, quantitative (not at [facility])  Total time of encounter: 35 minutes total time of encounter, including 25 minutes spent in face-to-face patient care. This time includes coordination of care and counseling regarding acute complaint, stat labs and work up. Remainder of non-face-to-face time involved reviewing chart documents/testing relevant to the patient encounter and documentation in the medical record.   This visit occurred during the SARS-CoV-2 public health emergency.  Safety protocols were in place, including screening questions prior to the visit, additional usage of staff PPE, and extensive cleaning of exam room while observing appropriate contact time as indicated for disinfecting solutions.     Return if symptoms worsen or fail to improve.    Orland Mustard, MD [place] [facility]   07/13/2019

## 2019-07-14 LAB — COMPREHENSIVE METABOLIC PANEL
ALT: 23 U/L (ref 0–35)
AST: 23 U/L (ref 0–37)
Albumin: 4.5 g/dL (ref 3.5–5.2)
Alkaline Phosphatase: 55 U/L (ref 39–117)
BUN: 20 mg/dL (ref 6–23)
CO2: 26 mEq/L (ref 19–32)
Calcium: 9.6 mg/dL (ref 8.4–10.5)
Chloride: 102 mEq/L (ref 96–112)
Creatinine, Ser: 0.77 mg/dL (ref 0.40–1.20)
GFR: 78.04 mL/min (ref >60.00)
Glucose, Bld: 99 mg/dL (ref 70–99)
Potassium: 4.4 mEq/L (ref 3.5–5.1)
Sodium: 138 mEq/L (ref 135–145)
Total Bilirubin: 0.4 mg/dL (ref 0.2–1.2)
Total Protein: 6.7 g/dL (ref 6.0–8.3)

## 2019-07-15 ENCOUNTER — Other Ambulatory Visit: Payer: Self-pay

## 2019-07-15 ENCOUNTER — Encounter: Payer: Self-pay | Admitting: Family Medicine

## 2019-07-15 ENCOUNTER — Ambulatory Visit (INDEPENDENT_AMBULATORY_CARE_PROVIDER_SITE_OTHER): Payer: BLUE CROSS/BLUE SHIELD | Admitting: Family Medicine

## 2019-07-15 ENCOUNTER — Ambulatory Visit: Payer: Self-pay

## 2019-07-15 VITALS — BP 120/70 | HR 69 | Ht 64.75 in | Wt 142.6 lb

## 2019-07-15 DIAGNOSIS — M25561 Pain in right knee: Secondary | ICD-10-CM

## 2019-07-15 NOTE — Progress Notes (Signed)
Subjective:    I'm seeing this patient as a consultation for:  Dr. Artis Flock. Note will be routed back to referring provider/PCP.  CC: R knee pain  I, Molly Weber, LAT, ATC, am serving as scribe for Dr. Clementeen Graham.  HPI: Pt is a 54 y/o female presenting w/ c/o R leg/knee pain and tingling x 1.5 weeks.  She states that her pain is located at her R distal, posterior thigh, R medial calf and R medial foot.  She rates her pain at a 6-7/10 and describes her pain as achy.  She was seen by her PCP recently for this issue who suspected Baker's cyst.  D-dimer was ordered which thankfully was negative.  Patient's medical history significant for rheumatoid arthritis currently well managed with methotrexate.  Radiating pain: No Numbness/tingling: Yes in the R foot Swelling: No according to pt Aggravating factors: Running; sometimes wakes her up at night Treatments tried: Aleve   Past medical history, Surgical history, Family history, Social history, Allergies, and medications have been entered into the medical record, reviewed.   Review of Systems: No new headache, visual changes, nausea, vomiting, diarrhea, constipation, dizziness, abdominal pain, skin rash, fevers, chills, night sweats, weight loss, swollen lymph nodes, body aches, joint swelling, muscle aches, chest pain, shortness of breath, mood changes, visual or auditory hallucinations.   Objective:    Vitals:   07/15/19 1510  BP: 120/70  Pulse: 69  SpO2: 96%   General: Well Developed, well nourished, and in no acute distress.  Neuro/Psych: Alert and oriented x3, extra-ocular muscles intact, able to move all 4 extremities, sensation grossly intact. Skin: Warm and dry, no rashes noted.  Respiratory: Not using accessory muscles, speaking in full sentences, trachea midline.  Cardiovascular: Pulses palpable, no extremity edema. Abdomen: Does not appear distended. MSK:  Right knee normal-appearing no effusion no deformities. Not  particularly tender to palpation. Normal motion without crepitation. Stable ligamentous exam. Negative Murray's test. Intact strength to flexion and extension  Slight pain with resisted flexion. Normal H test. Cap refill pulses and sensation intact distally.  Lab and Radiology Results  Diagnostic Limited MSK Ultrasound of: Right knee Quad tendon normal-appearing intact no significant joint effusion. Patellar tendon intact normal-appearing. Normal medial and lateral meniscus and joint lines. Tiny pea-sized Baker's cyst posterior medial knee.  Normal-appearing hamstring and posterior structures otherwise. Impression: Tiny Baker's cyst right knee.    Results for orders placed or performed in visit on 07/13/19 (from the past 72 hour(s))  D-dimer, quantitative (not at [facility])     Status: None   Collection Time: 07/13/19 11:37 AM  Result Value Ref Range   D-Dimer, Quant <0.19 <0.50 mcg/mL FEU    Comment: . The D-Dimer test is used frequently to exclude an acute PE or DVT. In patients with a low to moderate clinical risk assessment and a D-Dimer result <0.50 mcg/mL FEU, the likelihood of a PE or DVT is very low. However, a thromboembolic event should not be excluded solely on the basis of the D-Dimer level. Increased levels of D-Dimer are associated with a PE, DVT, DIC, malignancies, inflammation, sepsis, surgery, trauma, pregnancy, and advancing patient age. [Jama 2006 11:295(2):199-207] . For additional information, please refer to: http://education.questdiagnostics.com/faq/FAQ149 (This link is being provided for informational/ educational purposes only) .   Vitamin B12     Status: None   Collection Time: 07/13/19 11:37 AM  Result Value Ref Range   Vitamin B-12 245 211 - 911 pg/mL  TSH     Status:  None   Collection Time: 07/13/19 11:37 AM  Result Value Ref Range   TSH 0.99 0.35 - 4.50 uIU/mL  CBC with Differential/Platelet     Status: None   Collection Time: 07/13/19  11:37 AM  Result Value Ref Range   WBC 5.8 4.0 - 10.5 K/uL   RBC 4.17 3.87 - 5.11 Mil/uL   Hemoglobin 13.4 12.0 - 15.0 g/dL   HCT 39.7 36.0 - 46.0 %   MCV 95.3 78.0 - 100.0 fl   MCHC 33.8 30.0 - 36.0 g/dL   RDW 13.4 11.5 - 15.5 %   Platelets 290.0 150.0 - 400.0 K/uL   Neutrophils Relative % 57.1 43.0 - 77.0 %   Lymphocytes Relative 35.5 12.0 - 46.0 %   Monocytes Relative 5.5 3.0 - 12.0 %   Eosinophils Relative 1.1 0.0 - 5.0 %   Basophils Relative 0.8 0.0 - 3.0 %   Neutro Abs 3.3 1.4 - 7.7 K/uL   Lymphs Abs 2.1 0.7 - 4.0 K/uL   Monocytes Absolute 0.3 0.1 - 1.0 K/uL   Eosinophils Absolute 0.1 0.0 - 0.7 K/uL   Basophils Absolute 0.0 0.0 - 0.1 K/uL  Comprehensive metabolic panel     Status: None   Collection Time: 07/13/19 11:37 AM  Result Value Ref Range   Sodium 138 135 - 145 mEq/L   Potassium 4.4 3.5 - 5.1 mEq/L   Chloride 102 96 - 112 mEq/L   CO2 26 19 - 32 mEq/L   Glucose, Bld 99 70 - 99 mg/dL   BUN 20 6 - 23 mg/dL   Creatinine, Ser 0.77 0.40 - 1.20 mg/dL   Total Bilirubin 0.4 0.2 - 1.2 mg/dL   Alkaline Phosphatase 55 39 - 117 U/L   AST 23 0 - 37 U/L   ALT 23 0 - 35 U/L   Total Protein 6.7 6.0 - 8.3 g/dL   Albumin 4.5 3.5 - 5.2 g/dL   GFR 78.04 >60.00 mL/min   Calcium 9.6 8.4 - 10.5 mg/dL   No results found.     Impression and Recommendations:    Assessment and Plan: 54 y.o. female with right knee pain.  Patient likely did have a Baker's cyst when she was evaluated by her PCP but I think it spontaneously drained which would explain some of the pain into her medial calf and ankle.  Fortunately today she is doing much better with much less pain.  Fundamental cause of Baker's cyst likely is knee swelling from rheumatoid arthritis versus knee swelling from mild DJD.  Given that she is doing quite well we will proceed with conservative management.  Recommend hamstring strengthening exercises per Askling L protocol.  Additionally recommend compressive knee sleeve and Voltaren  gel.  If not improving or symptoms return next step would probably be steroid injection and x-ray.  Additionally patient mentions some chronic neck pain.  Happy to see her for this.  She will reschedule as needed for this issue in the future.   Orders Placed This Encounter  Procedures  . [place] LIMITED JOINT SPACE STRUCTURES LOW RIGHT(NO LINKED CHARGES)    Order Specific Question:   Reason for Exam (SYMPTOM  OR DIAGNOSIS REQUIRED)    Answer:   R knee pain    Order Specific Question:   Preferred imaging location?    Answer:   [place]   No orders of the defined types were placed in this encounter.   Discussed warning signs or symptoms. Please see discharge instructions. Patient  expresses understanding.   The above documentation has been reviewed and is accurate and complete Lynne Leader

## 2019-07-15 NOTE — Patient Instructions (Addendum)
Thank you for coming in today. I do think you did have a baker cyst.  It is mostly gone now.  Use compression sleeve during activtiy and for 30-60 mins following activity.  Body Helix full knee sleeve.  Use voltaren gel over the counter up to 4x daily.  Do hamstring exercises.   The Askling L Protocol for Hamstring Strains  Leeroy Cha PT  Keep me updated.  Recheck as needed.  I am also happy to look into your neck as well.

## 2019-08-12 DIAGNOSIS — Z79890 Hormone replacement therapy (postmenopausal): Secondary | ICD-10-CM | POA: Diagnosis not present

## 2019-08-12 DIAGNOSIS — Z01419 Encounter for gynecological examination (general) (routine) without abnormal findings: Secondary | ICD-10-CM | POA: Diagnosis not present

## 2019-08-12 DIAGNOSIS — Z6823 Body mass index (BMI) 23.0-23.9, adult: Secondary | ICD-10-CM | POA: Diagnosis not present

## 2019-08-12 DIAGNOSIS — Z1231 Encounter for screening mammogram for malignant neoplasm of breast: Secondary | ICD-10-CM | POA: Diagnosis not present

## 2019-08-31 ENCOUNTER — Encounter: Payer: Self-pay | Admitting: Family Medicine

## 2019-08-31 ENCOUNTER — Ambulatory Visit (INDEPENDENT_AMBULATORY_CARE_PROVIDER_SITE_OTHER): Payer: BC Managed Care – PPO | Admitting: Family Medicine

## 2019-08-31 ENCOUNTER — Other Ambulatory Visit: Payer: Self-pay

## 2019-08-31 VITALS — BP 124/74 | HR 79 | Temp 98.5°F | Ht 65.0 in | Wt 138.2 lb

## 2019-08-31 DIAGNOSIS — Z114 Encounter for screening for human immunodeficiency virus [HIV]: Secondary | ICD-10-CM

## 2019-08-31 DIAGNOSIS — Z0000 Encounter for general adult medical examination without abnormal findings: Secondary | ICD-10-CM | POA: Diagnosis not present

## 2019-08-31 LAB — LIPID PANEL
Cholesterol: 233 mg/dL — ABNORMAL HIGH (ref 0–200)
HDL: 72.5 mg/dL (ref >39.00)
LDL Cholesterol: 144 mg/dL — ABNORMAL HIGH (ref 0–99)
NonHDL: 160.54
Total CHOL/HDL Ratio: 3
Triglycerides: 82 mg/dL (ref 0.0–149.0)
VLDL: 16.4 mg/dL (ref 0.0–40.0)

## 2019-08-31 NOTE — Progress Notes (Signed)
Patient: Hannah Vincent MRN: [id number] DOB: 04-Feb-1966 PCP: Orma Flaming, MD     Subjective:  Chief Complaint  Patient presents with  . Transitions Of Care    Patient mentioned that she would get these really bad headaches at bedtime. She stated that she has started wearing compression stockings and the headaches have gone away.   . Annual Exam    HPI: The patient is a 54 y.o. female who presents today for annual exam. She denies any changes to past medical history. There have been no recent hospitalizations. They are following a well balanced diet and exercise plan. Weight has been stable. She has had her covid shots. She does have RA and is followed by Dr. Trudie Reed with [facility]. She is a runner and is quite healthy.   No family history of colon cancer in first degree relative, does have hx of maternal grandfather with colon cancer. No history of breast cancer.   She states a year ago she started to have odd symptoms with heart racing, headaches. She thinks she may have had covid, but was also going through menopause and has RA.  She wears compression hose at night which has helped her headaches and racing heart that she would experience at night. She is wondering if this is correlated. Everything has resolved at this time. She did fine with her covid vaccines.   Immunization History  Administered Date(s) Administered  . Influenza,inj,Quad PF,6+ Mos 02/23/2019  . PFIZER SARS-COV-2 Vaccination 07/31/2019, 08/21/2019   Colonoscopy: 09/05/2016 Mammogram: 06/19/2018 Pap smear: 06/20/2018   Review of Systems  Constitutional: Negative for chills, fatigue and fever.  HENT: Negative for dental problem, ear pain, hearing loss and trouble swallowing.   Eyes: Negative for visual disturbance.  Respiratory: Negative for cough, chest tightness and shortness of breath.   Cardiovascular: Negative for chest pain, palpitations and leg swelling.  Gastrointestinal: Negative for  abdominal pain, blood in stool, diarrhea and nausea.  Endocrine: Negative for cold intolerance, polydipsia, polyphagia and polyuria.  Genitourinary: Negative for dysuria and hematuria.  Musculoskeletal: Negative for arthralgias.  Skin: Negative for rash.  Neurological: Negative for dizziness and headaches.  Psychiatric/Behavioral: Negative for dysphoric mood and sleep disturbance. The patient is not nervous/anxious.     Allergies Patient is allergic to other.  Past Medical History Patient  has a past medical history of Family history of colon cancer, LBBB (left bundle branch block), and RA (rheumatoid arthritis) ([place]).  Surgical History Patient  has a past surgical history that includes Tonsillectomy and Dilation and curettage of uterus (1995).  Family History Pateint's family history includes Birth defects in her mother; Colon cancer (age of onset: 62) in her maternal grandfather; Heart disease in her father; Hypertension in her mother.  Social History Patient  reports that she has never smoked. She has never used smokeless tobacco. She reports that she does not drink alcohol or use drugs.    Objective: Vitals:   08/31/19 0949  BP: 124/74  Pulse: 79  Temp: 98.5 F (36.9 C)  TempSrc: Temporal  SpO2: 96%  Weight: 138 lb 3.2 oz (62.7 kg)  Height: 5\' 5"  (1.651 m)    Body mass index is 23 kg/m.  Physical Exam Vitals reviewed.  Constitutional:      Appearance: Normal appearance. She is well-developed and normal weight.  HENT:     Head: Normocephalic and atraumatic.     Right Ear: Tympanic membrane, ear canal and external ear normal.     Left Ear: Tympanic  membrane, ear canal and external ear normal.     Mouth/Throat:     Mouth: Mucous membranes are moist.  Eyes:     Extraocular Movements: Extraocular movements intact.     Conjunctiva/sclera: Conjunctivae normal.     Pupils: Pupils are equal, round, and reactive to light.  Neck:     Thyroid: No thyromegaly.      Vascular: No carotid bruit.  Cardiovascular:     Rate and Rhythm: Normal rate and regular rhythm.     Pulses: Normal pulses.     Heart sounds: Normal heart sounds. No murmur.  Pulmonary:     Effort: Pulmonary effort is normal.     Breath sounds: Normal breath sounds.  Abdominal:     General: Abdomen is flat. Bowel sounds are normal. There is no distension.     Palpations: Abdomen is soft.     Tenderness: There is no abdominal tenderness.  Musculoskeletal:     Cervical back: Normal range of motion and neck supple.  Lymphadenopathy:     Cervical: No cervical adenopathy.  Skin:    General: Skin is warm and dry.     Capillary Refill: Capillary refill takes less than 2 seconds.     Findings: No rash.  Neurological:     General: No focal deficit present.     Mental Status: She is alert and oriented to person, place, and time.     Cranial Nerves: No cranial nerve deficit.     Coordination: Coordination normal.     Deep Tendon Reflexes: Reflexes normal.  Psychiatric:        Mood and Affect: Mood normal.        Behavior: Behavior normal.      Office Visit from 02/23/2019 in [facility]  PHQ-2 Total Score  1          Assessment/plan: 1. Annual physical exam We just checked her labs back in February and everything was normal. Will check lipid panel today, otherwise utd and wnl. She is very healthy. UTD on HM, just needs tdap, but can't get today due to covid vaccine. Also requested records for her pap/mmg and cscope. She will try to get these to [place]. She is doing well, continue running. F/u in one year or as needed.  Patient counseling [x]    Nutrition: Stressed importance of moderation in sodium/caffeine intake, saturated fat and cholesterol, caloric balance, sufficient intake of fresh fruits, vegetables, fiber, calcium, iron, and 1 mg of folate supplement per day (for females capable of pregnancy).  [x]    Stressed the importance of regular exercise.   []     Substance Abuse: Discussed cessation/primary prevention of tobacco, alcohol, or other drug use; driving or other dangerous activities under the influence; availability of treatment for abuse.   [x]    Injury prevention: Discussed safety belts, safety helmets, smoke detector, smoking near bedding or upholstery.   [x]    Sexuality: Discussed sexually transmitted diseases, partner selection, use of condoms, avoidance of unintended pregnancy  and contraceptive alternatives.  [x]    Dental health: Discussed importance of regular tooth brushing, flossing, and dental visits.  [x]    Health maintenance and immunizations reviewed. Please refer to Health maintenance section.    - Lipid panel  2. Encounter for screening for HIV  - HIV Antibody (routine testing w rflx)   This visit occurred during the SARS-CoV-2 public health emergency.  Safety protocols were in place, including screening questions prior to the visit, additional usage of staff PPE, and  extensive cleaning of exam room while observing appropriate contact time as indicated for disinfecting solutions.     Return in about 1 year (around 08/30/2020) for annual or as needed. Orland Mustard, MD  [facility]  08/31/2019

## 2019-08-31 NOTE — Patient Instructions (Signed)
Need tdap (tetanus) but have to wait 4 weeks from covid shot. Can look in records too. Also try to get records for colonoscopy, pap and mammogram please. You look great!   Dr. Rogers Blocker   Preventive Care 27-55 Years Old, Female Preventive care refers to visits with your health care provider and lifestyle choices that can promote health and wellness. This includes:  A yearly physical exam. This may also be called an annual well check.  Regular dental visits and eye exams.  Immunizations.  Screening for certain conditions.  Healthy lifestyle choices, such as eating a healthy diet, getting regular exercise, not using drugs or products that contain nicotine and tobacco, and limiting alcohol use. What can I expect for my preventive care visit? Physical exam Your health care provider will check your:  Height and weight. This may be used to calculate body mass index (BMI), which tells if you are at a healthy weight.  Heart rate and blood pressure.  Skin for abnormal spots. Counseling Your health care provider may ask you questions about your:  Alcohol, tobacco, and drug use.  Emotional well-being.  Home and relationship well-being.  Sexual activity.  Eating habits.  Work and work Statistician.  Method of birth control.  Menstrual cycle.  Pregnancy history. What immunizations do I need?  Influenza (flu) vaccine  This is recommended every year. Tetanus, diphtheria, and pertussis (Tdap) vaccine  You may need a Td booster every 10 years. Varicella (chickenpox) vaccine  You may need this if you have not been vaccinated. Zoster (shingles) vaccine  You may need this after age 11. Measles, mumps, and rubella (MMR) vaccine  You may need at least one dose of MMR if you were born in 1957 or later. You may also need a second dose. Pneumococcal conjugate (PCV13) vaccine  You may need this if you have certain conditions and were not previously vaccinated. Pneumococcal  polysaccharide (PPSV23) vaccine  You may need one or two doses if you smoke cigarettes or if you have certain conditions. Meningococcal conjugate (MenACWY) vaccine  You may need this if you have certain conditions. Hepatitis A vaccine  You may need this if you have certain conditions or if you travel or work in places where you may be exposed to hepatitis A. Hepatitis B vaccine  You may need this if you have certain conditions or if you travel or work in places where you may be exposed to hepatitis B. Haemophilus influenzae type b (Hib) vaccine  You may need this if you have certain conditions. Human papillomavirus (HPV) vaccine  If recommended by your health care provider, you may need three doses over 6 months. You may receive vaccines as individual doses or as more than one vaccine together in one shot (combination vaccines). Talk with your health care provider about the risks and benefits of combination vaccines. What tests do I need? Blood tests  Lipid and cholesterol levels. These may be checked every 5 years, or more frequently if you are over 62 years old.  Hepatitis C test.  Hepatitis B test. Screening  Lung cancer screening. You may have this screening every year starting at age 76 if you have a 30-pack-year history of smoking and currently smoke or have quit within the past 15 years.  Colorectal cancer screening. All adults should have this screening starting at age 45 and continuing until age 61. Your health care provider may recommend screening at age 45 if you are at increased risk. You will have tests every  1-10 years, depending on your results and the type of screening test.  Diabetes screening. This is done by checking your blood sugar (glucose) after you have not eaten for a while (fasting). You may have this done every 1-3 years.  Mammogram. This may be done every 1-2 years. Talk with your health care provider about when you should start having regular  mammograms. This may depend on whether you have a family history of breast cancer.  BRCA-related cancer screening. This may be done if you have a family history of breast, ovarian, tubal, or peritoneal cancers.  Pelvic exam and Pap test. This may be done every 3 years starting at age 86. Starting at age 66, this may be done every 5 years if you have a Pap test in combination with an HPV test. Other tests  Sexually transmitted disease (STD) testing.  Bone density scan. This is done to screen for osteoporosis. You may have this scan if you are at high risk for osteoporosis. Follow these instructions at home: Eating and drinking  Eat a diet that includes fresh fruits and vegetables, whole grains, lean protein, and low-fat dairy.  Take vitamin and mineral supplements as recommended by your health care provider.  Do not drink alcohol if: ? Your health care provider tells you not to drink. ? You are pregnant, may be pregnant, or are planning to become pregnant.  If you drink alcohol: ? Limit how much you have to 0-1 drink a day. ? Be aware of how much alcohol is in your drink. In the [place] one drink equals one 12 oz bottle of beer (355 mL), one 5 oz glass of wine (148 mL), or one 1 oz glass of hard liquor (44 mL). Lifestyle  Take daily care of your teeth and gums.  Stay active. Exercise for at least 30 minutes on 5 or more days each week.  Do not use any products that contain nicotine or tobacco, such as cigarettes, e-cigarettes, and chewing tobacco. If you need help quitting, ask your health care provider.  If you are sexually active, practice safe sex. Use a condom or other form of birth control (contraception) in order to prevent pregnancy and STIs (sexually transmitted infections).  If told by your health care provider, take low-dose aspirin daily starting at age 47. What's next?  Visit your health care provider once a year for a well check visit.  Ask your health care provider  how often you should have your eyes and teeth checked.  Stay up to date on all vaccines. This information is not intended to replace advice given to you by your health care provider. Make sure you discuss any questions you have with your health care provider. Document Revised: 01/16/2018 Document Reviewed: 01/16/2018 Elsevier Patient Education  2020 [employer].

## 2019-09-01 LAB — HIV ANTIBODY (ROUTINE TESTING W REFLEX): HIV 1&2 Ab, 4th Generation: NONREACTIVE

## 2019-10-15 DIAGNOSIS — M2550 Pain in unspecified joint: Secondary | ICD-10-CM | POA: Diagnosis not present

## 2019-10-15 DIAGNOSIS — M150 Primary generalized (osteo)arthritis: Secondary | ICD-10-CM | POA: Diagnosis not present

## 2019-10-15 DIAGNOSIS — Z79899 Other long term (current) drug therapy: Secondary | ICD-10-CM | POA: Diagnosis not present

## 2019-10-15 DIAGNOSIS — M0589 Other rheumatoid arthritis with rheumatoid factor of multiple sites: Secondary | ICD-10-CM | POA: Diagnosis not present

## 2020-01-19 DIAGNOSIS — M0589 Other rheumatoid arthritis with rheumatoid factor of multiple sites: Secondary | ICD-10-CM | POA: Diagnosis not present

## 2020-04-18 DIAGNOSIS — Z79899 Other long term (current) drug therapy: Secondary | ICD-10-CM | POA: Diagnosis not present

## 2020-04-18 DIAGNOSIS — M2550 Pain in unspecified joint: Secondary | ICD-10-CM | POA: Diagnosis not present

## 2020-04-18 DIAGNOSIS — M150 Primary generalized (osteo)arthritis: Secondary | ICD-10-CM | POA: Diagnosis not present

## 2020-04-18 DIAGNOSIS — M0589 Other rheumatoid arthritis with rheumatoid factor of multiple sites: Secondary | ICD-10-CM | POA: Diagnosis not present

## 2020-04-18 DIAGNOSIS — Z23 Encounter for immunization: Secondary | ICD-10-CM | POA: Diagnosis not present

## 2020-07-29 ENCOUNTER — Telehealth: Payer: Self-pay

## 2020-07-29 NOTE — Telephone Encounter (Signed)
FYI; I also attempted to check on the pt, and send to ER. Lvm for pt to call the office back.

## 2020-07-29 NOTE — Telephone Encounter (Signed)
Spoke with Dr.Andy and she advised to schedule patient next week with PCP, attempted to contact patient twice with no success.   Nurse Assessment Nurse: May, RN, Tammy Date/Time Lamount Cohen Time): 07/29/2020 8:51:43 AM Confirm and document reason for call. If symptomatic, describe symptoms. ---Caller states she has right foot numbness intermittently, headache and heart palpitations intermittently. Caller states at night she notices the racing of her heart and headache. Does the patient have any new or worsening symptoms? ---Yes Will a triage be completed? ---Yes Related visit to physician within the last 2 weeks? ---No Does the PT have any chronic conditions? (i.e. diabetes, asthma, this includes High risk factors for pregnancy, etc.) ---Yes List chronic conditions. ---RA Is this a behavioral health or substance abuse call? ---No Guidelines Guideline Title Affirmed Question Affirmed Notes Nurse Date/Time (Eastern Time) Neurologic Deficit Patient sounds very sick or weak to the triager May, RN, Tammy 07/29/2020 8:53:54 AM Disp. Time Lamount Cohen Time) Disposition Final User 07/29/2020 9:00:11 AM Go to ED Now (or PCP triage) Yes May, RN, Tammy Caller Disagree/Comply Disagree Caller Understands Yes PLEASE NOTE: All timestamps contained within this report are represented as [place] Standard Time. CONFIDENTIALTY NOTICE: This fax transmission is intended only for the addressee. It contains information that is legally privileged, confidential or otherwise protected from use or disclosure. If you are not the intended recipient, you are strictly prohibited from reviewing, disclosing, copying using or disseminating any of this information or taking any action in reliance on or regarding this information. If you have received this fax in error, please notify us immediately by telephone so that we can arrange for its return to [place]. Phone: [phone], Toll-Free: [phone], Fax: [phone] Page: 2 of  2 Call Id: [id number] PreDisposition Did not know what to do Care Advice Given Per Guideline GO TO ED NOW (OR PCP TRIAGE): * IF NO PCP (PRIMARY CARE PROVIDER) SECOND-LEVEL TRIAGE: You need to be seen within the next hour. Go to the ED/UCC at _____________ Hospital. Leave as soon as you can. CARE ADVICE given per Neurologic Deficit (Adult) guideline. Comments User: Lorelee Market, RN Date/Time Lamount Cohen Time): 07/29/2020 9:03:36 AM Caller refused ER. Per client directive called and spoke with office staff and provided pt name, c/o, dob etc. Advised caller will receive a call from the office. Referrals GO TO FACILITY REFUSED

## 2020-08-01 NOTE — Telephone Encounter (Signed)
Patient called back and she said she was doing a little better but still has some pain, requested an appointment for Thursday, so patient is scheduled.

## 2020-08-04 ENCOUNTER — Ambulatory Visit (INDEPENDENT_AMBULATORY_CARE_PROVIDER_SITE_OTHER): Payer: BC Managed Care – PPO | Admitting: Family Medicine

## 2020-08-04 ENCOUNTER — Encounter: Payer: Self-pay | Admitting: Family Medicine

## 2020-08-04 ENCOUNTER — Other Ambulatory Visit: Payer: Self-pay

## 2020-08-04 VITALS — BP 126/70 | HR 69 | Temp 97.8°F | Ht 65.0 in | Wt 146.8 lb

## 2020-08-04 DIAGNOSIS — G43109 Migraine with aura, not intractable, without status migrainosus: Secondary | ICD-10-CM

## 2020-08-04 DIAGNOSIS — R002 Palpitations: Secondary | ICD-10-CM

## 2020-08-04 LAB — COMPREHENSIVE METABOLIC PANEL
ALT: 15 U/L (ref 0–35)
AST: 17 U/L (ref 0–37)
Albumin: 4.4 g/dL (ref 3.5–5.2)
Alkaline Phosphatase: 50 U/L (ref 39–117)
BUN: 15 mg/dL (ref 6–23)
CO2: 26 mEq/L (ref 19–32)
Calcium: 9.4 mg/dL (ref 8.4–10.5)
Chloride: 103 mEq/L (ref 96–112)
Creatinine, Ser: 0.62 mg/dL (ref 0.40–1.20)
GFR: 100.31 mL/min (ref >60.00)
Glucose, Bld: 83 mg/dL (ref 70–99)
Potassium: 4.5 mEq/L (ref 3.5–5.1)
Sodium: 138 mEq/L (ref 135–145)
Total Bilirubin: 0.5 mg/dL (ref 0.2–1.2)
Total Protein: 6.8 g/dL (ref 6.0–8.3)

## 2020-08-04 LAB — CBC WITH DIFFERENTIAL/PLATELET
Basophils Absolute: 0 10*3/uL (ref 0.0–0.1)
Basophils Relative: 0.5 % (ref 0.0–3.0)
Eosinophils Absolute: 0 10*3/uL (ref 0.0–0.7)
Eosinophils Relative: 0.9 % (ref 0.0–5.0)
HCT: 40.4 % (ref 36.0–46.0)
Hemoglobin: 13.8 g/dL (ref 12.0–15.0)
Lymphocytes Relative: 41.2 % (ref 12.0–46.0)
Lymphs Abs: 2.2 10*3/uL (ref 0.7–4.0)
MCHC: 34.1 g/dL (ref 30.0–36.0)
MCV: 93.2 fl (ref 78.0–100.0)
Monocytes Absolute: 0.4 10*3/uL (ref 0.1–1.0)
Monocytes Relative: 6.5 % (ref 3.0–12.0)
Neutro Abs: 2.8 10*3/uL (ref 1.4–7.7)
Neutrophils Relative %: 50.9 % (ref 43.0–77.0)
Platelets: 297 10*3/uL (ref 150.0–400.0)
RBC: 4.34 Mil/uL (ref 3.87–5.11)
RDW: 13.7 % (ref 11.5–15.5)
WBC: 5.4 10*3/uL (ref 4.0–10.5)

## 2020-08-04 LAB — TSH: TSH: 0.81 u[IU]/mL (ref 0.35–4.50)

## 2020-08-04 NOTE — Progress Notes (Signed)
Patient: Hannah Vincent MRN: [id number] DOB: Apr 02, 1966 PCP: Orland Mustard, MD     Subjective:  Chief Complaint  Patient presents with  . Numbness  . Headache  . Palpitations    Pt says that she is feeling better today. This comes and goes with all symptoms at the same time. It has worsened in the last week.     HPI: The patient is a 55 y.o. female who presents today for right leg and foot numbness, a headache, and palpitations. This all happened last week. She has right leg numbness on and off, but it was worse last week.  She states last Thursday and Friday she would have severe headaches and numbness down her right leg and foot. Seemed to be worse in the evening and at night. At night she will feel her heart had palpitations as well. She doesn't know if it was fast.   Headaches: half of her head. Described as intense. Was continuous for a couple of days. She had been on trip and was in the car for a long time and isn't sure if this contributed. She had no vision changes or focal deficits. She did have right leg weakness and tingling/decreased sensation. Not a thunderclap headache. She didn't sleep good during this time. She slept with her head elevated and she took a baby aspirin that maybe helped her some. She also tried ibuprofen, but this didn't help her at all. She has not had any headaches this week. She did have some spotting over the weekend, so her hormones may have been off even though on HRT. Wasn't sure if this contributed. Denies any aura, vision changes.   Palpitations -has had this in the past and had worked up. Seemed to happen after covid infection. She had covid in January and then a month afterwards seemed to notice the palpitations. She had no shortness of breath or chest pain with these episodes. She will feel faint if she walks up stairs and feels better after she has a Gatorade. This was worse in 2020 and have been better.  These have also resolved this week.    Right leg numbness and weakness This was intermittent. Seemed to be associated with headache. She has had not had this before. She wasn't completely numb. It just felt harder to make it work. She did notice when she drove, it made this worse. She wonders if being in the car for such a long time this exacerbated it. The car does seem to aggrevate it. She denies any burning, but does have tingling.   Review of Systems  Constitutional: Negative for chills, fatigue and fever.  HENT: Negative for dental problem, ear pain, hearing loss and trouble swallowing.   Eyes: Negative for visual disturbance.  Respiratory: Negative for cough, chest tightness and shortness of breath.   Cardiovascular: Negative for chest pain, palpitations and leg swelling.  Gastrointestinal: Negative for abdominal pain, blood in stool, diarrhea and nausea.  Endocrine: Negative for cold intolerance, polydipsia, polyphagia and polyuria.  Genitourinary: Negative for dysuria and hematuria.  Musculoskeletal: Negative for arthralgias.  Skin: Negative for rash.  Neurological: Negative for dizziness and headaches.  Psychiatric/Behavioral: Negative for dysphoric mood and sleep disturbance. The patient is not nervous/anxious.     Allergies Patient is allergic to other.  Past Medical History Patient  has a past medical history of Family history of colon cancer, LBBB (left bundle branch block), and RA (rheumatoid arthritis) (HCC).  Surgical History Patient  has a past surgical  history that includes Tonsillectomy and Dilation and curettage of uterus (1995).  Family History Pateint's family history includes Birth defects in her mother; Colon cancer (age of onset: 17) in her maternal grandfather; Heart disease in her father; Hypertension in her mother.  Social History Patient  reports that she has never smoked. She has never used smokeless tobacco. She reports that she does not drink alcohol and does not use drugs.     Objective: Vitals:   08/04/20 1106  BP: 126/70  Pulse: 69  Temp: 97.8 F (36.6 C)  TempSrc: Temporal  SpO2: 97%  Weight: 146 lb 12.8 oz (66.6 kg)  Height: 5\' 5"  (1.651 m)    Body mass index is 24.43 kg/m.  Physical Exam Vitals reviewed.  Constitutional:      Appearance: She is well-developed and normal weight.  HENT:     Head: Normocephalic and atraumatic.     Right Ear: External ear normal.     Left Ear: External ear normal.  Eyes:     Extraocular Movements: Extraocular movements intact.     Conjunctiva/sclera: Conjunctivae normal.     Pupils: Pupils are equal, round, and reactive to light.  Neck:     Thyroid: No thyromegaly.  Cardiovascular:     Rate and Rhythm: Normal rate and regular rhythm.     Heart sounds: Normal heart sounds. No murmur heard.   Pulmonary:     Effort: Pulmonary effort is normal.     Breath sounds: Normal breath sounds.  Abdominal:     General: Bowel sounds are normal. There is no distension.     Palpations: Abdomen is soft.     Tenderness: There is no abdominal tenderness.  Musculoskeletal:     Cervical back: Normal range of motion and neck supple.  Lymphadenopathy:     Cervical: No cervical adenopathy.  Skin:    General: Skin is warm and dry.     Capillary Refill: Capillary refill takes less than 2 seconds.     Findings: No rash.  Neurological:     Mental Status: She is alert and oriented to person, place, and time.     Cranial Nerves: No cranial nerve deficit, dysarthria or facial asymmetry.     Sensory: No sensory deficit.     Motor: No weakness.     Coordination: Coordination normal.     Gait: Gait normal.     Deep Tendon Reflexes: Reflexes normal.  Psychiatric:        Mood and Affect: Mood normal.        Speech: Speech normal.        Behavior: Behavior normal.      ekg similar to past readings.     Assessment/plan: 1. Palpitations Has been worked up by cardiology in the past. zio patch showed runs of nonsustained  atrial tachycardia and sinus tachycardia. Cardiologist recommended cutting out all caffiene and to start toprol xl 25mg  daily. She never started this. Has seemed to have resolved over this past week. Will check labs and ekg similar to past tracings. If continues discussed we can start her on beta blocker and have her f/u with dr. .  - EKG 12-Lead - CBC with Differential/Platelet - Comprehensive metabolic panel - TSH  2. Complicated migraine Unusual and with the right sided weakness ? Hemiplegic migraine. Has resolved now, but checking MRI. Discussed er if happens again.  - MR Brain W Wo Contrast; Future   This visit occurred during the SARS-CoV-2 public health emergency.  Safety  protocols were in place, including screening questions prior to the visit, additional usage of staff PPE, and extensive cleaning of exam room while observing appropriate contact time as indicated for disinfecting solutions.     Return if symptoms worsen or fail to improve.   Orland Mustard, MD  [facility]   08/04/2020

## 2020-08-04 NOTE — Patient Instructions (Signed)
-  check labs and MRI of brain -if palpitations continue let me know -if right leg continues let me know as this sounds more back related/ortho from being in car.    Good to see you! Dr Artis Flock

## 2020-08-30 ENCOUNTER — Other Ambulatory Visit: Payer: BC Managed Care – PPO

## 2020-08-31 ENCOUNTER — Encounter: Payer: BC Managed Care – PPO | Admitting: Family Medicine

## 2020-09-11 ENCOUNTER — Ambulatory Visit
Admission: RE | Admit: 2020-09-11 | Discharge: 2020-09-11 | Disposition: A | Discharge status: Home / Self Care | Payer: BC Managed Care – PPO | Source: Ambulatory Visit | Attending: Family Medicine | Admitting: Family Medicine

## 2020-09-11 ENCOUNTER — Other Ambulatory Visit: Payer: Self-pay

## 2020-09-11 DIAGNOSIS — G43109 Migraine with aura, not intractable, without status migrainosus: Secondary | ICD-10-CM

## 2020-09-11 DIAGNOSIS — R519 Headache, unspecified: Secondary | ICD-10-CM | POA: Diagnosis not present

## 2020-09-11 MED ORDER — GADOBENATE DIMEGLUMINE 529 MG/ML IV SOLN
11.0000 mL | Freq: Once | INTRAVENOUS | Status: AC | PRN
  Administered 2020-09-11: 11 mL via INTRAVENOUS

## 2020-09-16 ENCOUNTER — Encounter: Payer: Self-pay | Admitting: Family Medicine

## 2020-09-16 ENCOUNTER — Ambulatory Visit (INDEPENDENT_AMBULATORY_CARE_PROVIDER_SITE_OTHER): Payer: BC Managed Care – PPO | Admitting: Family Medicine

## 2020-09-16 ENCOUNTER — Other Ambulatory Visit: Payer: Self-pay

## 2020-09-16 VITALS — BP 104/66 | HR 72 | Temp 98.0°F | Ht 65.0 in | Wt 147.4 lb

## 2020-09-16 DIAGNOSIS — Z1159 Encounter for screening for other viral diseases: Secondary | ICD-10-CM | POA: Diagnosis not present

## 2020-09-16 DIAGNOSIS — G43001 Migraine without aura, not intractable, with status migrainosus: Secondary | ICD-10-CM | POA: Diagnosis not present

## 2020-09-16 DIAGNOSIS — Z0000 Encounter for general adult medical examination without abnormal findings: Secondary | ICD-10-CM | POA: Diagnosis not present

## 2020-09-16 LAB — CBC WITH DIFFERENTIAL/PLATELET
Basophils Absolute: 0 10*3/uL (ref 0.0–0.1)
Basophils Relative: 0.8 % (ref 0.0–3.0)
Eosinophils Absolute: 0.1 10*3/uL (ref 0.0–0.7)
Eosinophils Relative: 1.2 % (ref 0.0–5.0)
HCT: 41.3 % (ref 36.0–46.0)
Hemoglobin: 13.8 g/dL (ref 12.0–15.0)
Lymphocytes Relative: 39.7 % (ref 12.0–46.0)
Lymphs Abs: 1.6 10*3/uL (ref 0.7–4.0)
MCHC: 33.5 g/dL (ref 30.0–36.0)
MCV: 93.4 fl (ref 78.0–100.0)
Monocytes Absolute: 0.3 10*3/uL (ref 0.1–1.0)
Monocytes Relative: 7.2 % (ref 3.0–12.0)
Neutro Abs: 2.1 10*3/uL (ref 1.4–7.7)
Neutrophils Relative %: 51.1 % (ref 43.0–77.0)
Platelets: 289 10*3/uL (ref 150.0–400.0)
RBC: 4.42 Mil/uL (ref 3.87–5.11)
RDW: 13.2 % (ref 11.5–15.5)
WBC: 4.1 10*3/uL (ref 4.0–10.5)

## 2020-09-16 LAB — LIPID PANEL
Cholesterol: 194 mg/dL (ref 0–200)
HDL: 66.5 mg/dL (ref >39.00)
LDL Cholesterol: 108 mg/dL — ABNORMAL HIGH (ref 0–99)
NonHDL: 127.81
Total CHOL/HDL Ratio: 3
Triglycerides: 100 mg/dL (ref 0.0–149.0)
VLDL: 20 mg/dL (ref 0.0–40.0)

## 2020-09-16 LAB — COMPREHENSIVE METABOLIC PANEL
ALT: 20 U/L (ref 0–35)
AST: 20 U/L (ref 0–37)
Albumin: 4.3 g/dL (ref 3.5–5.2)
Alkaline Phosphatase: 55 U/L (ref 39–117)
BUN: 14 mg/dL (ref 6–23)
CO2: 27 mEq/L (ref 19–32)
Calcium: 9.2 mg/dL (ref 8.4–10.5)
Chloride: 104 mEq/L (ref 96–112)
Creatinine, Ser: 0.6 mg/dL (ref 0.40–1.20)
GFR: 101.03 mL/min (ref >60.00)
Glucose, Bld: 59 mg/dL — ABNORMAL LOW (ref 70–99)
Potassium: 4.3 mEq/L (ref 3.5–5.1)
Sodium: 139 mEq/L (ref 135–145)
Total Bilirubin: 0.4 mg/dL (ref 0.2–1.2)
Total Protein: 6.7 g/dL (ref 6.0–8.3)

## 2020-09-16 LAB — TSH: TSH: 0.85 u[IU]/mL (ref 0.35–4.50)

## 2020-09-16 NOTE — Progress Notes (Signed)
Patient: Hannah Vincent MRN: [id number] DOB: 1966-02-09 PCP: Orland Mustard, MD     Subjective:  Chief Complaint  Patient presents with  . Annual Exam  . Arthritis    HPI: The patient is a 55 y.o. female who presents today for annual exam. She denies any changes to past medical history. There have been no recent hospitalizations. They are following a well balanced diet and exercise plan. She runs every other day. Weight has been increasing steadily. She believes it may be from menopause. She would like to discuss results from her MRI.   No family hx of colon or breast cancer.   Immunization History  Administered Date(s) Administered  . Influenza,inj,Quad PF,6+ Mos 02/23/2019  . PFIZER(Purple Top)SARS-COV-2 Vaccination 07/31/2019, 08/21/2019, 03/25/2020   Colonoscopy: 08/28/2016 Mammogram: next week  Pap smear: 06/19/2018   Review of Systems  Constitutional: Negative for chills, fatigue and fever.  HENT: Negative for dental problem, ear pain, hearing loss and trouble swallowing.   Eyes: Negative for visual disturbance.  Respiratory: Negative for cough, chest tightness and shortness of breath.   Cardiovascular: Negative for chest pain, palpitations and leg swelling.  Gastrointestinal: Negative for abdominal pain, blood in stool, diarrhea and nausea.  Endocrine: Negative for cold intolerance, polydipsia, polyphagia and polyuria.  Genitourinary: Negative for dysuria and hematuria.  Musculoskeletal: Negative for arthralgias.  Skin: Negative for rash.  Neurological: Negative for dizziness and headaches.  Psychiatric/Behavioral: Negative for dysphoric mood and sleep disturbance. The patient is not nervous/anxious.     Allergies Patient is allergic to other.  Past Medical History Patient  has a past medical history of Family history of colon cancer, LBBB (left bundle branch block), and RA (rheumatoid arthritis) (HCC).  Surgical History Patient  has a past surgical history  that includes Tonsillectomy and Dilation and curettage of uterus (1995).  Family History Pateint's family history includes Birth defects in her mother; Colon cancer (age of onset: 37) in her maternal grandfather; Heart disease in her father; Hypertension in her mother.  Social History Patient  reports that she has never smoked. She has never used smokeless tobacco. She reports that she does not drink alcohol and does not use drugs.    Objective: Vitals:   09/16/20 1012  BP: 104/66  Pulse: 72  Temp: 98 F (36.7 C)  TempSrc: Temporal  SpO2: 98%  Weight: 147 lb 6.4 oz (66.9 kg)  Height: 5\' 5"  (1.651 m)    Body mass index is 24.53 kg/m.  Physical Exam Vitals reviewed.  Constitutional:      Appearance: Normal appearance. She is well-developed and normal weight.  HENT:     Head: Normocephalic and atraumatic.     Right Ear: Tympanic membrane, ear canal and external ear normal.     Left Ear: Tympanic membrane, ear canal and external ear normal.     Mouth/Throat:     Mouth: Mucous membranes are moist.  Eyes:     Extraocular Movements: Extraocular movements intact.     Conjunctiva/sclera: Conjunctivae normal.     Pupils: Pupils are equal, round, and reactive to light.  Neck:     Thyroid: No thyromegaly.     Vascular: No carotid bruit.  Cardiovascular:     Rate and Rhythm: Normal rate and regular rhythm.     Pulses: Normal pulses.     Heart sounds: Normal heart sounds. No murmur heard.   Pulmonary:     Effort: Pulmonary effort is normal.     Breath sounds: Normal breath sounds.  Abdominal:     General: Abdomen is flat. Bowel sounds are normal. There is no distension.     Palpations: Abdomen is soft.     Tenderness: There is no abdominal tenderness.  Musculoskeletal:     Cervical back: Normal range of motion and neck supple.  Lymphadenopathy:     Cervical: No cervical adenopathy.  Skin:    General: Skin is warm and dry.     Capillary Refill: Capillary refill takes less  than 2 seconds.     Findings: No rash.  Neurological:     General: No focal deficit present.     Mental Status: She is alert and oriented to person, place, and time.     Cranial Nerves: No cranial nerve deficit.     Coordination: Coordination normal.     Deep Tendon Reflexes: Reflexes normal.  Psychiatric:        Mood and Affect: Mood normal.        Behavior: Behavior normal.    Flowsheet Row Office Visit from 09/16/2020 in [facility]  PHQ-2 Total Score 0          Assessment/plan: 1. Annual physical exam Routine labs today. She did eat oatmeal. Very fit and healthy. Continue active lifestyle. Hm reviewed. Discussed tdap and she is going to look into her records. Has mmg upcoming. F/u in one year or as needed.  Patient counseling [x]    Nutrition: Stressed importance of moderation in sodium/caffeine intake, saturated fat and cholesterol, caloric balance, sufficient intake of fresh fruits, vegetables, fiber, calcium, iron, and 1 mg of folate supplement per day (for females capable of pregnancy).  [x]    Stressed the importance of regular exercise.   []    Substance Abuse: Discussed cessation/primary prevention of tobacco, alcohol, or other drug use; driving or other dangerous activities under the influence; availability of treatment for abuse.   [x]    Injury prevention: Discussed safety belts, safety helmets, smoke detector, smoking near bedding or upholstery.   [x]    Sexuality: Discussed sexually transmitted diseases, partner selection, use of condoms, avoidance of unintended pregnancy  and contraceptive alternatives.  [x]    Dental health: Discussed importance of regular tooth brushing, flossing, and dental visits.  [x]    Health maintenance and immunizations reviewed. Please refer to Health maintenance section.    - CBC with Differential/Platelet - Comprehensive metabolic panel - Lipid panel - TSH  2. Encounter for hepatitis C screening test for low risk  patient  - Hepatitis C antibody  3. Migraine without aura and with status migrainosus, not intractable With complicated migraine and MRI with nonspecific flair findings I would prefer she see neurology to make sure no further work up/repeat imaging be done. She is onboard for this.  - Ambulatory referral to Neurology     Return in about 1 year (around 09/16/2021) for annual .     , MD [facility]  09/16/2020

## 2020-09-16 NOTE — Patient Instructions (Addendum)
-referral to neurology for mri/headache follow up   Preventive Care 50-56 Years Old, Female Preventive care refers to lifestyle choices and visits with your health care provider that can promote health and wellness. This includes:  A yearly physical exam. This is also called an annual wellness visit.  Regular dental and eye exams.  Immunizations.  Screening for certain conditions.  Healthy lifestyle choices, such as: ? Eating a healthy diet. ? Getting regular exercise. ? Not using drugs or products that contain nicotine and tobacco. ? Limiting alcohol use. What can I expect for my preventive care visit? Physical exam Your health care provider will check your:  Height and weight. These may be used to calculate your BMI (body mass index). BMI is a measurement that tells if you are at a healthy weight.  Heart rate and blood pressure.  Body temperature.  Skin for abnormal spots. Counseling Your health care provider may ask you questions about your:  Past medical problems.  Family's medical history.  Alcohol, tobacco, and drug use.  Emotional well-being.  Home life and relationship well-being.  Sexual activity.  Diet, exercise, and sleep habits.  Work and work Statistician.  Access to firearms.  Method of birth control.  Menstrual cycle.  Pregnancy history. What immunizations do I need? Vaccines are usually given at various ages, according to a schedule. Your health care provider will recommend vaccines for you based on your age, medical history, and lifestyle or other factors, such as travel or where you work.   What tests do I need? Blood tests  Lipid and cholesterol levels. These may be checked every 5 years, or more often if you are over 105 years old.  Hepatitis C test.  Hepatitis B test. Screening  Lung cancer screening. You may have this screening every year starting at age 1 if you have a 30-pack-year history of smoking and currently smoke or have  quit within the past 15 years.  Colorectal cancer screening. ? All adults should have this screening starting at age 61 and continuing until age 31. ? Your health care provider may recommend screening at age 50 if you are at increased risk. ? You will have tests every 1-10 years, depending on your results and the type of screening test.  Diabetes screening. ? This is done by checking your blood sugar (glucose) after you have not eaten for a while (fasting). ? You may have this done every 1-3 years.  Mammogram. ? This may be done every 1-2 years. ? Talk with your health care provider about when you should start having regular mammograms. This may depend on whether you have a family history of breast cancer.  BRCA-related cancer screening. This may be done if you have a family history of breast, ovarian, tubal, or peritoneal cancers.  Pelvic exam and Pap test. ? This may be done every 3 years starting at age 52. ? Starting at age 21, this may be done every 5 years if you have a Pap test in combination with an HPV test. Other tests  STD (sexually transmitted disease) testing, if you are at risk.  Bone density scan. This is done to screen for osteoporosis. You may have this scan if you are at high risk for osteoporosis. Talk with your health care provider about your test results, treatment options, and if necessary, the need for more tests. Follow these instructions at home: Eating and drinking  Eat a diet that includes fresh fruits and vegetables, whole grains, lean protein, and  low-fat dairy products.  Take vitamin and mineral supplements as recommended by your health care provider.  Do not drink alcohol if: ? Your health care provider tells you not to drink. ? You are pregnant, may be pregnant, or are planning to become pregnant.  If you drink alcohol: ? Limit how much you have to 0-1 drink a day. ? Be aware of how much alcohol is in your drink. In the [place] one drink equals one  12 oz bottle of beer (355 mL), one 5 oz glass of wine (148 mL), or one 1 oz glass of hard liquor (44 mL).   Lifestyle  Take daily care of your teeth and gums. Brush your teeth every morning and night with fluoride toothpaste. Floss one time each day.  Stay active. Exercise for at least 30 minutes 5 or more days each week.  Do not use any products that contain nicotine or tobacco, such as cigarettes, e-cigarettes, and chewing tobacco. If you need help quitting, ask your health care provider.  Do not use drugs.  If you are sexually active, practice safe sex. Use a condom or other form of protection to prevent STIs (sexually transmitted infections).  If you do not wish to become pregnant, use a form of birth control. If you plan to become pregnant, see your health care provider for a prepregnancy visit.  If told by your health care provider, take low-dose aspirin daily starting at age 15.  Find healthy ways to cope with stress, such as: ? Meditation, yoga, or listening to music. ? Journaling. ? Talking to a trusted person. ? Spending time with friends and family. Safety  Always wear your seat belt while driving or riding in a vehicle.  Do not drive: ? If you have been drinking alcohol. Do not ride with someone who has been drinking. ? When you are tired or distracted. ? While texting.  Wear a helmet and other protective equipment during sports activities.  If you have firearms in your house, make sure you follow all gun safety procedures. What's next?  Visit your health care provider once a year for an annual wellness visit.  Ask your health care provider how often you should have your eyes and teeth checked.  Stay up to date on all vaccines. This information is not intended to replace advice given to you by your health care provider. Make sure you discuss any questions you have with your health care provider. Document Revised: 02/09/2020 Document Reviewed:  01/16/2018 Elsevier Patient Education  2021 [employer].

## 2020-09-19 LAB — HEPATITIS C ANTIBODY
Hepatitis C Ab: NONREACTIVE
SIGNAL TO CUT-OFF: 0 (ref <1.00)

## 2020-09-20 DIAGNOSIS — Z01419 Encounter for gynecological examination (general) (routine) without abnormal findings: Secondary | ICD-10-CM | POA: Diagnosis not present

## 2020-09-20 DIAGNOSIS — Z1231 Encounter for screening mammogram for malignant neoplasm of breast: Secondary | ICD-10-CM | POA: Diagnosis not present

## 2020-09-20 DIAGNOSIS — Z1211 Encounter for screening for malignant neoplasm of colon: Secondary | ICD-10-CM | POA: Diagnosis not present

## 2020-09-20 DIAGNOSIS — Z79890 Hormone replacement therapy (postmenopausal): Secondary | ICD-10-CM | POA: Diagnosis not present

## 2020-11-02 DIAGNOSIS — M0589 Other rheumatoid arthritis with rheumatoid factor of multiple sites: Secondary | ICD-10-CM | POA: Diagnosis not present

## 2020-11-02 DIAGNOSIS — Z79899 Other long term (current) drug therapy: Secondary | ICD-10-CM | POA: Diagnosis not present

## 2020-11-02 DIAGNOSIS — M150 Primary generalized (osteo)arthritis: Secondary | ICD-10-CM | POA: Diagnosis not present

## 2020-11-02 DIAGNOSIS — M2550 Pain in unspecified joint: Secondary | ICD-10-CM | POA: Diagnosis not present

## 2020-12-27 ENCOUNTER — Ambulatory Visit: Payer: BC Managed Care – PPO | Admitting: Neurology

## 2021-02-02 DIAGNOSIS — M0589 Other rheumatoid arthritis with rheumatoid factor of multiple sites: Secondary | ICD-10-CM | POA: Diagnosis not present

## 2021-04-23 NOTE — Progress Notes (Deleted)
[facility]    Provider:  Dr Lucia Gaskins Requesting Provider: Orland Mustard, MD Primary Care Provider:  Pcp, No  CC:  ***  HPI:  Hannah Vincent is a 55 y.o. female here as requested by Orland Mustard, MD for migraines. PMHx migraines, osteoarthritis of spine with radiculopathy cervical region, rheumatoid arthritis  Reviewed notes, labs and imaging from outside physicians, which showed:  MRI brain 08/2020: IMPRESSION: 1. No evidence of acute intracranial abnormality. 2. Mildly age advanced T2/FLAIR hyperintensities within the bifrontal predominant white matter. These are nonspecific, but most likely represent the sequela of chronic microvascular ischemic disease or chronic migraines (given the provided clinical history and frontal predominance). Additional differential considerations include the sequela of prior demyelination or inflammation.    I reviewed Dr. Terrance Mass notes, with complicated migraine and MRI with nonspecific FLAIR findings, she was referred to make sure no further work-up or repeat imaging needs to be done.  Review of Systems: Patient complains of symptoms per HPI as well as the following symptoms ***. Pertinent negatives and positives per HPI. All others negative.   Social History   Socioeconomic History   Marital status: Married    Spouse name: Not on file   Number of children: Not on file   Years of education: Not on file   Highest education level: Not on file  Occupational History   Not on file  Tobacco Use   Smoking status: Never   Smokeless tobacco: Never  Substance and Sexual Activity   Alcohol use: No   Drug use: No   Sexual activity: Yes    Birth control/protection: Rhythm  Other Topics Concern   Not on file  Social History Narrative   Not on file   Social Determinants of Health   Financial Resource Strain: Not on file  Food Insecurity: Not on file  Transportation Needs: Not on file  Physical Activity: Not on file   Stress: Not on file  Social Connections: Not on file  Intimate Partner Violence: Not on file    Family History  Problem Relation Age of Onset   Colon cancer Maternal Grandfather 39   Birth defects Mother    Hypertension Mother    Heart disease Father     Past Medical History:  Diagnosis Date   Family history of colon cancer    LBBB (left bundle branch block)    RA (rheumatoid arthritis) (HCC)     Patient Active Problem List   Diagnosis Date Noted   Osteoarthritis of spine with radiculopathy, cervical region 02/23/2019   Menopausal state 02/23/2019   Rheumatoid arthritis (HCC)     Past Surgical History:  Procedure Laterality Date   DILATION AND CURETTAGE OF UTERUS  1995   PARTIAL MOLE   TONSILLECTOMY      Current Outpatient Medications  Medication Sig Dispense Refill   Calcium Carb-Cholecalciferol (CALCIUM + VITAMIN D3) 500-400 MG-UNIT CHEW Calcium 500 + D     D3-50 1.25 MG (50000 UT) capsule TAKE 1 CAPSULE BY MOUTH ONE TIME PER WEEK     estradiol (CLIMARA - DOSED IN MG/24 HR) 0.05 mg/24hr patch APPLY 1 PATCH EVERY WEEK     folic acid (FOLVITE) 1 MG tablet Take 1 mg by mouth daily.     meloxicam (MOBIC) 15 MG tablet Take 15 mg by mouth as needed.      methotrexate (RHEUMATREX) 2.5 MG tablet Take 2.5 mg by mouth once a week. Caution:Chemotherapy. Protect from light.     Omega-3 Fatty Acids (  FISH OIL) 1000 MG CAPS Fish Oil     progesterone (PROMETRIUM) 100 MG capsule TAKE 1 CAPSULE(S) EVERY DAY BY ORAL ROUTE FOR 12 DAYS.     progesterone (PROMETRIUM) 200 MG capsule Take by mouth.     No current facility-administered medications for this visit.    Allergies as of 04/24/2021 - Review Complete 09/16/2020  Allergen Reaction Noted   Other  09/24/2018    Vitals: There were no vitals taken for this visit. Last Weight:  Wt Readings from Last 1 Encounters:  09/16/20 147 lb 6.4 oz (66.9 kg)   Last Height:   Ht Readings from Last 1 Encounters:  09/16/20 5\' 5"  (1.651  m)     Physical exam: Exam: Gen: NAD, conversant, well nourised, obese, well groomed                     CV: RRR, no MRG. No Carotid Bruits. No peripheral edema, warm, nontender Eyes: Conjunctivae clear without exudates or hemorrhage  Neuro: Detailed Neurologic Exam  Speech:    Speech is normal; fluent and spontaneous with normal comprehension.  Cognition:    The patient is oriented to person, place, and time;     recent and remote memory intact;     language fluent;     normal attention, concentration,     fund of knowledge Cranial Nerves:    The pupils are equal, round, and reactive to light. The fundi are normal and spontaneous venous pulsations are present. Visual fields are full to finger confrontation. Extraocular movements are intact. Trigeminal sensation is intact and the muscles of mastication are normal. The face is symmetric. The palate elevates in the midline. Hearing intact. Voice is normal. Shoulder shrug is normal. The tongue has normal motion without fasciculations.   Coordination:    Normal finger to nose and heel to shin. Normal rapid alternating movements.   Gait:    Heel-toe and tandem gait are normal.   Motor Observation:    No asymmetry, no atrophy, and no involuntary movements noted. Tone:    Normal muscle tone.    Posture:    Posture is normal. normal erect    Strength:    Strength is V/V in the upper and lower limbs.      Sensation: intact to LT     Reflex Exam:  DTR's:    Deep tendon reflexes in the upper and lower extremities are normal bilaterally.   Toes:    The toes are downgoing bilaterally.   Clonus:    Clonus is absent.    Assessment/Plan:    No orders of the defined types were placed in this encounter.  No orders of the defined types were placed in this encounter.   Cc: , MD,  Pcp, No  Orland Mustard, MD  [facility] [address] [place], [place]  Phone  [phone] Fax [phone]

## 2021-04-24 ENCOUNTER — Ambulatory Visit: Payer: BC Managed Care – PPO | Admitting: Neurology

## 2021-05-03 ENCOUNTER — Ambulatory Visit (INDEPENDENT_AMBULATORY_CARE_PROVIDER_SITE_OTHER): Payer: BC Managed Care – PPO | Admitting: Neurology

## 2021-05-03 ENCOUNTER — Encounter: Payer: Self-pay | Admitting: Neurology

## 2021-05-03 VITALS — BP 125/82 | HR 67 | Ht 65.0 in | Wt 151.5 lb

## 2021-05-03 DIAGNOSIS — R7309 Other abnormal glucose: Secondary | ICD-10-CM

## 2021-05-03 DIAGNOSIS — M542 Cervicalgia: Secondary | ICD-10-CM | POA: Diagnosis not present

## 2021-05-03 DIAGNOSIS — E785 Hyperlipidemia, unspecified: Secondary | ICD-10-CM | POA: Diagnosis not present

## 2021-05-03 DIAGNOSIS — M5481 Occipital neuralgia: Secondary | ICD-10-CM | POA: Diagnosis not present

## 2021-05-03 DIAGNOSIS — M5030 Other cervical disc degeneration, unspecified cervical region: Secondary | ICD-10-CM

## 2021-05-03 DIAGNOSIS — G43009 Migraine without aura, not intractable, without status migrainosus: Secondary | ICD-10-CM

## 2021-05-03 NOTE — Patient Instructions (Addendum)
Cervicalgia and occipital nerve irritation: Likely from arthritis in the neck. Options: Working with PT: Physical Therapy: Cervical myofascial pain, forward posture contributing to cervicalgia. Please evaluate and treat including dry needling, stretching, strengthening, manual therapy/massage, heating, TENS unit, exercising for scapular stabilization, pectoral stretching and rhomboid strengthening as clinically warranted as well as any other modality as recommended by evaluation.[facility] vs [facility]. MRI of the cervical spine and injections - facet injections Medications: Cymbalta, Lyrica   White matter changes in brain: normal cognitive aging and can be more prevalent in migraines. Watch cholesterol, blood pressure, diabetes, get exercise. Recommend repeating in 1 year.   Occipital Neuralgia Occipital neuralgia is a type of headache that causes brief episodes of very bad pain in the back of the head. Pain from occipital neuralgia may spread (radiate) to other parts of the head. These headaches may be caused by irritation of the nerves that leave the spinal cord high up in the neck, just below the base of the skull (occipital nerves). The occipital nerves transmit sensations from the back of the head, the top of the head, and the areas behind the ears. What are the causes? This condition can occur without any known cause (primary headache syndrome). In other cases, this condition is caused by pressure on or irritation of one of the two occipital nerves. Pressure and irritation may be due to: Muscle spasm in the neck. Neck injury. Wear and tear of the vertebrae in the neck (osteoarthritis). Disease of the disks that separate the vertebrae. Swollen blood vessels that put pressure on the occipital nerves. Infections. Tumors. Diabetes. What are the signs or symptoms? This condition causes brief burning, stabbing, electric, shocking, or shooting pain in the back of the head that can radiate to  the top of the head. It can happen on one side or both sides of the head. It can also cause: Pain behind the eye. Pain triggered by neck movement or hair brushing. Scalp tenderness. Aching in the back of the head between episodes of very bad pain. Pain that gets worse with exposure to bright lights. How is this diagnosed? Your health care provider may diagnose the condition based on a physical exam and your symptoms. Tests may be done, such as: Imaging studies of the brain and neck (cervical spine), such as an MRI or CT scan. These look for causes of pinched nerves. Applying pressure to the nerves in the neck to try to re-create the pain. Injection of numbing medicine into the occipital nerve areas to see if pain goes away (diagnostic nerve block). How is this treated? Treatment for this condition may begin with simple measures, such as: Rest. Massage. Applying heat or cold to the area. Over-the-counter pain relievers. If these measures do not work, you may need other treatments, including: Medicines, such as: Prescription-strength anti-inflammatory medicines. Muscle relaxants. Anti-seizure medicines, which can relieve pain. Antidepressants, which can relieve pain. Injected medicines, such as medicines that numb the area (local anesthetic) and steroids. Pulsed radiofrequency ablation. This is when wires are implanted to deliver electrical impulses that block pain signals from the occipital nerve. Surgery to relieve nerve pressure. Physical therapy. Follow these instructions at home: Managing pain   Avoid any activities that cause pain. Rest when you have an attack of pain. Try gentle massage to relieve pain. Try a different pillow or sleeping position. If directed, apply heat to the affected area as often as told by your health care provider. Use the heat source that your health care provider  recommends, such as a moist heat pack or a heating pad. Place a towel between your skin  and the heat source. Leave the heat on for 20-30 minutes. Remove the heat if your skin turns bright red. This is especially important if you are unable to feel pain, heat, or cold. You have a greater risk of getting burned. If directed, put ice on the back of your head and neck area. To do this: Put ice in a plastic bag. Place a towel between your skin and the bag. Leave the ice on for 20 minutes, 2-3 times a day. Remove the ice if your skin turns bright red. This is very important. If you cannot feel pain, heat, or cold, you have a greater risk of damage to the area. General instructions Take over-the-counter and prescription medicines only as told by your health care provider. Avoid things that make your symptoms worse, such as bright lights. Try to stay active. Get regular exercise that does not cause pain. Ask your health care provider to suggest safe exercises for you. Work with a physical therapist to learn stretching exercises you can do at home. Practice good posture. Keep all follow-up visits. This is important. Contact a health care provider if: Your medicine is not working. You have new or worsening symptoms. Get help right away if: You have very bad head pain that does not go away. You have a sudden change in vision, balance, or speech. These symptoms may represent a serious problem that is an emergency. Do not wait to see if the symptoms will go away. Get medical help right away. Call your local emergency services (911 in the [place] Do not drive yourself to the hospital. Summary Occipital neuralgia is a type of headache that causes brief episodes of very bad pain in the back of the head. Pain from occipital neuralgia may spread (radiate) to other parts of the head. Treatment for this condition includes rest, massage, and medicines. This information is not intended to replace advice given to you by your health care provider. Make sure you discuss any questions you have with your  health care provider. Document Revised: 03/06/2020 Document Reviewed: 03/06/2020 Elsevier Patient Education  2022 [employer].

## 2021-05-03 NOTE — Progress Notes (Signed)
[facility]    Provider:  Dr Lucia Gaskins Requesting Provider: Orland Mustard MD Primary Care Provider:  Orland Mustard MD  CC:  Migraines, neck pain, cervicalgia  HPI:  Hannah Vincent is a 55 y.o. female here as requested by No ref. provider found for migraines. PMHx migraines, osteoarthritis of spine with radiculopathy cervical region, rheumatoid arthritis. Headaches started after menopause. She has hld, low b12, vitamin d deficiency. No Fhx of migraines. She has some arthritis in her neck and can get headaches, unilateral, maybe some pressure and light sensitivity and maybe some numbness down an arm, she has neck pain. The neck pain getting more regular with exercise. If she takes ibuprofen before bed the neck pain and headache gets better. The neck exercises helped her but she didn;t follow through. No other focal neurologic deficits, associated symptoms, inciting events or modifiable factors.  Reviewed notes, labs and imaging from outside physicians, which showed:  Reviewed imaging and agree  FINDINGS: DG cervical spine Disc space narrowing and spurring at C5-6, stable. Mild left neural foraminal narrowing at C5-6 and C3-4 due to uncovertebral spurring and facet disease. No fracture. Normal alignment. Prevertebral soft tissues are normal.   IMPRESSION: Degenerative changes as above. Mild left neural foraminal narrowing at C3-4 and C5-6. No acute bony abnormality.  4/252022: TSH, cmp/cbc  MRI brain 08/2020: IMPRESSION: (reviewed images with patient as well) 1. No evidence of acute intracranial abnormality. 2. Mildly age advanced T2/FLAIR hyperintensities within the bifrontal predominant white matter. These are nonspecific, but most likely represent the sequela of chronic microvascular ischemic disease or chronic migraines (given the provided clinical history and frontal predominance). Additional differential considerations include the sequela of prior  demyelination or inflammation.    I reviewed Dr. Terrance Mass notes, with complicated migraine and MRI with nonspecific FLAIR findings, she was referred to make sure no further work-up or repeat imaging needs to be done.   Review of Systems: Patient complains of symptoms per HPI as well as the following symptoms abnormal MRI. Pertinent negatives and positives per HPI. All others negative.   Social History   Socioeconomic History   Marital status: Married    Spouse name: Not on file   Number of children: Not on file   Years of education: Not on file   Highest education level: Not on file  Occupational History   Not on file  Tobacco Use   Smoking status: Never   Smokeless tobacco: Never  Vaping Use   Vaping Use: Never used  Substance and Sexual Activity   Alcohol use: No   Drug use: No   Sexual activity: Yes    Birth control/protection: Rhythm  Other Topics Concern   Not on file  Social History Narrative   Caffeine 2 cups daily.     Education: Buyer, retail   Working:  [employer]   Social Determinants of Corporate investment banker Strain: Not on [employer] Insecurity: Not on file  Transportation Needs: Not on file  Physical Activity: Not on file  Stress: Not on file  Social Connections: Not on file  Intimate Partner Violence: Not on file    Family History  Problem Relation Age of Onset   Birth defects Mother    Hypertension Mother    Heart disease Father    Colon cancer Maternal Grandfather 40   Migraines Neg Hx     Past Medical History:  Diagnosis Date   Family history of colon cancer    LBBB (  left bundle branch block)    RA (rheumatoid arthritis) (HCC)     Patient Active Problem List   Diagnosis Date Noted   Cervicalgia 05/08/2021   Occipital neuralgia of right side 05/08/2021   Degenerative disc disease, cervical 05/08/2021   Migraine without aura and without status migrainosus, not intractable 05/08/2021   Osteoarthritis of spine with  radiculopathy, cervical region 02/23/2019   Menopausal state 02/23/2019   Rheumatoid arthritis (HCC)     Past Surgical History:  Procedure Laterality Date   DILATION AND CURETTAGE OF UTERUS  1995   PARTIAL MOLE   TONSILLECTOMY      Current Outpatient Medications  Medication Sig Dispense Refill   Calcium Carb-Cholecalciferol (CALCIUM + VITAMIN D3) 500-400 MG-UNIT CHEW Calcium 500 + D     D3-50 1.25 MG (50000 UT) capsule TAKE 1 CAPSULE BY MOUTH ONE TIME PER WEEK     estradiol (CLIMARA - DOSED IN MG/24 HR) 0.05 mg/24hr patch APPLY 1 PATCH EVERY WEEK     folic acid (FOLVITE) 1 MG tablet Take 1 mg by mouth daily.     meloxicam (MOBIC) 15 MG tablet Take 15 mg by mouth as needed.      methotrexate (RHEUMATREX) 2.5 MG tablet Take 2.5 mg by mouth once a week. Caution:Chemotherapy. Protect from light.     Omega-3 Fatty Acids (FISH OIL) 1000 MG CAPS Fish Oil     progesterone (PROMETRIUM) 100 MG capsule TAKE 1 CAPSULE(S) EVERY DAY BY ORAL ROUTE FOR 12 DAYS.     No current facility-administered medications for this visit.    Allergies as of 05/03/2021 - Review Complete 05/03/2021  Allergen Reaction Noted   Other  09/24/2018    Vitals: BP 125/82    Pulse 67    Ht 5\' 5"  (1.651 m)    Wt 151 lb 8 oz (68.7 kg)    BMI 25.21 kg/m  Last Weight:  Wt Readings from Last 1 Encounters:  05/03/21 151 lb 8 oz (68.7 kg)   Last Height:   Ht Readings from Last 1 Encounters:  05/03/21 5\' 5"  (1.651 m)     Physical exam: Exam: Gen: NAD, conversant, well nourised, well groomed                     CV: RRR, no MRG. No Carotid Bruits. No peripheral edema, warm, nontender Eyes: Conjunctivae clear without exudates or hemorrhage  Neuro: Detailed Neurologic Exam  Speech:    Speech is normal; fluent and spontaneous with normal comprehension.  Cognition:    The patient is oriented to person, place, and time;     recent and remote memory intact;     language fluent;     normal attention, concentration,      fund of knowledge Cranial Nerves:    The pupils are equal, round, and reactive to light. The fundi are flat Visual fields are full to finger confrontation. Extraocular movements are intact. Trigeminal sensation is intact and the muscles of mastication are normal. The face is symmetric. The palate elevates in the midline. Hearing intact. Voice is normal. Shoulder shrug is normal. The tongue has normal motion without fasciculations.   Coordination:    Normal   Gait:     normal.   Motor Observation:    No asymmetry, no atrophy, and no involuntary movements noted. Tone:    Normal muscle tone.    Posture:    Posture is normal. normal erect    Strength:    Strength is  V/V in the upper and lower limbs.      Sensation: intact to LT     Reflex Exam:  DTR's:    Deep tendon reflexes in the upper and lower extremities are normal bilaterally.   Toes:    The toes are downgoing bilaterally.   Clonus:    Clonus is absent.    Assessment/Plan:  Patient with neck pain and cervicalgia.   Cervicalgia and occipital nerve irritation: Likely from arthritis in the neck. We discussed occipital nerve, degenerative neck changes. Also white matter changes in the brain  CERVICALGIA Options: Working with PT: Physical Therapy Cervical myofascial pain, degernerative cervical changes, occipital neuralgia and posture contributing to cervicalgia. Please evaluate and treat including dry needling, stretching, strengthening, manual therapy/massage, heating, TENS unit, exercising for scapular stabilization, pectoral stretching and rhomboid strengthening as clinically warranted as well as any other modality as recommended by evaluation. SEND TO  [facility] if they also perform dry needling otherwise send to [facility]. If above doesn't work, MRI of the cervical spine and injections - facet injections Last decided on Medications: Cymbalta, Lyrica  White matter changes in brain: Nonspecific, mild, likely chronic  microvascular changes. Can be due to normal cognitive aging and can be more prevalent in migraines. Watch cholesterol, blood pressure, diabetes, get exercise. Recommend repeating in 1 year.   Orders Placed This Encounter  Procedures   Hemoglobin A1c   B12 and Folate Panel   Methylmalonic acid, serum   Vitamin D, 25-hydroxy   Hemoglobin A1c   Ambulatory referral to Physical Therapy    No orders of the defined types were placed in this encounter.    Cc: Orland Mustard MD  Naomie Dean, MD  [facility] [address] [place], [place] [address]  Phone [phone] Fax [phone]

## 2021-05-06 LAB — B12 AND FOLATE PANEL
Folate: >20 ng/mL (ref >3.0)
Vitamin B-12: 331 pg/mL (ref 232–1245)

## 2021-05-06 LAB — HEMOGLOBIN A1C
Est. average glucose Bld gHb Est-mCnc: 108 mg/dL
Hgb A1c MFr Bld: 5.4 % (ref 4.8–5.6)

## 2021-05-06 LAB — VITAMIN D 25 HYDROXY (VIT D DEFICIENCY, FRACTURES): Vit D, 25-Hydroxy: 37.7 ng/mL (ref 30.0–100.0)

## 2021-05-06 LAB — METHYLMALONIC ACID, SERUM: Methylmalonic Acid: 227 nmol/L (ref 0–378)

## 2021-05-08 ENCOUNTER — Encounter: Payer: Self-pay | Admitting: Neurology

## 2021-05-08 DIAGNOSIS — M542 Cervicalgia: Secondary | ICD-10-CM | POA: Insufficient documentation

## 2021-05-08 DIAGNOSIS — M5030 Other cervical disc degeneration, unspecified cervical region: Secondary | ICD-10-CM | POA: Insufficient documentation

## 2021-05-08 DIAGNOSIS — G43009 Migraine without aura, not intractable, without status migrainosus: Secondary | ICD-10-CM | POA: Insufficient documentation

## 2021-05-08 DIAGNOSIS — M5481 Occipital neuralgia: Secondary | ICD-10-CM | POA: Insufficient documentation

## 2021-05-30 ENCOUNTER — Ambulatory Visit (HOSPITAL_BASED_OUTPATIENT_CLINIC_OR_DEPARTMENT_OTHER): Payer: BC Managed Care – PPO | Admitting: Physical Therapy

## 2021-06-19 ENCOUNTER — Encounter: Payer: Self-pay | Admitting: Podiatry

## 2021-06-19 ENCOUNTER — Ambulatory Visit (INDEPENDENT_AMBULATORY_CARE_PROVIDER_SITE_OTHER): Payer: BC Managed Care – PPO | Admitting: Podiatry

## 2021-06-19 ENCOUNTER — Other Ambulatory Visit: Payer: Self-pay

## 2021-06-19 DIAGNOSIS — L600 Ingrowing nail: Secondary | ICD-10-CM

## 2021-06-19 DIAGNOSIS — B351 Tinea unguium: Secondary | ICD-10-CM

## 2021-06-19 NOTE — Progress Notes (Signed)
Subjective:   Patient ID: Hannah Vincent, female   DOB: 56 y.o.   MRN: [id number]   HPI Patient presents stating that her right big toenail is partially off its been moderately tender and traumatized and several other nails have discoloration and she is concerned about those also has a secondary problem.  Does think that she had trauma and its been this way for several months and patient does not smoke likes to be active   Review of Systems  All other systems reviewed and are negative.      Objective:  Physical Exam Vitals and nursing note reviewed.  Constitutional:      Appearance: She is well-developed.  Pulmonary:     Effort: Pulmonary effort is normal.  Musculoskeletal:        General: Normal range of motion.  Skin:    General: Skin is warm.  Neurological:     Mental Status: She is alert.    Neurovascular status intact muscle strength adequate range of motion adequate with patient right hallux nail partially detached and loose with discoloration of the entire nail bed with several other nails which have partial distal coloration but not loose or giving any other form of pathology     Assessment:  Probable trauma to the right hallux nail with a combination of a traumatic incident plus fungal incident along with mycotic nail beds of several other nails     Plan:  H&P condition reviewed and at this point discussed right nail recommended removal but not permanent.  Patient wants to have this done understanding that it may grow back abnormal and ultimately may require a permanent correction and wants per surgery and at this point infiltrated the right hallux 60 mg like Marcaine mixture sterile prep done and using sterile instrumentation remove the hallux nail patient tolerated well and allowed for clean base with no trauma to the underlying base applied sterile dressing and dispensed fungal topical medicine for adjacent nails.  Discussed possible permanent procedure at 1 point in  future

## 2021-06-19 NOTE — Patient Instructions (Signed)

## 2021-06-22 NOTE — Therapy (Signed)
OUTPATIENT PHYSICAL THERAPY CERVICAL EVALUATION   Patient Name: Hannah Vincent MRN: [id number] DOB:[birth year], 56 y.o., female Today's Date: 06/23/2021   PT End of Session - 06/23/21 1202     Visit Number 1    Number of Visits 17    Date for PT Re-Evaluation 09/21/21    Authorization Type [employer]    PT Start Time 1100    PT Stop Time 1145    PT Time Calculation (min) 45 min    Activity Tolerance Patient tolerated treatment well    Behavior During Therapy WFL for tasks assessed/performed             Past Medical History:  Diagnosis Date   Family history of colon cancer    LBBB (left bundle branch block)    RA (rheumatoid arthritis) (HCC)    Past Surgical History:  Procedure Laterality Date   DILATION AND CURETTAGE OF UTERUS  1995   PARTIAL MOLE   TONSILLECTOMY     Patient Active Problem List   Diagnosis Date Noted   Cervicalgia 05/08/2021   Occipital neuralgia of right side 05/08/2021   Degenerative disc disease, cervical 05/08/2021   Migraine without aura and without status migrainosus, not intractable 05/08/2021   Osteoarthritis of spine with radiculopathy, cervical region 02/23/2019   Menopausal state 02/23/2019   Rheumatoid arthritis (HCC)     PCP: Orland Mustard, MD  REFERRING PROVIDER: Anson Fret, MD  REFERRING DIAG: M54.2 (ICD-10-CM) - Cervicalgia M54.81 (ICD-10-CM) - Occipital neuralgia of right side M50.30 (ICD-10-CM) - Degenerative disc disease, cervical   THERAPY DIAG:  Cervicalgia  Muscle weakness (generalized)   ONSET DATE: 2020  SUBJECTIVE:                                                                                                                                                                                      SUBJECTIVE STATEMENT: Pt states the pain is related to walking/running. She does have OA and RA in her neck that might be "pressing" on something. Exercising feels great but pain occurs at night after exercise- causes HA.  She has not walked this week and currently not in pain. Pt states that neck feels fine at rest but the neck is sore after exercise. Pt does not do resistance training at this time. Has done some more strength based yoga recent. Pt denies vision changes or dizziness with neck pain or HA. Pt has had history of R sided arm pain that came down the elbow. Pt denies NT into hands.  Pt has seen PT before in the past.    Pt is driving most of the day. Pt is also at a  desk at home.   Pt denies cancer red flags.    PERTINENT HISTORY: Per MD note- PMHx osteoarthritis of spine with radiculopathy cervical region, rheumatoid arthritis.   PAIN:  Are you having pain? No NPRS scale: Worst 6-7/10 HA pain Pain location: R or L sided  Pain orientation: Right and Left  PAIN TYPE: radiating pain Pain description: intermittent  Aggravating factors: running/walking, anything impact- 1-1.5 miles at a time, driving Relieving factors: ibuprofen   PRECAUTIONS: None  WEIGHT BEARING RESTRICTIONS No  FALLS:  Has patient fallen in last 6 months? No Number of falls: 0  LIVING ENVIRONMENT: Lives with: lives with their family   OCCUPATION: [employer]   PLOF: Independent  PATIENT GOALS : Pt states she would like to go back to exercise and return to running if possible without recreating HA.   OBJECTIVE:   DIAGNOSTIC FINDINGS:  FINDINGS: DG cervical spine Disc space narrowing and spurring at C5-6, stable. Mild left neural foraminal narrowing at C5-6 and C3-4 due to uncovertebral spurring and facet disease. No fracture. Normal alignment. Prevertebral soft tissues are normal.   IMPRESSION: Degenerative changes as above. Mild left neural foraminal narrowing at C3-4 and C5-6. No acute bony abnormality.  Brain MR:  IMPRESSION: 1. No evidence of acute intracranial abnormality. 2. Mildly age advanced T2/FLAIR hyperintensities within the bifrontal predominant white matter. These are nonspecific, but  most likely represent the sequela of chronic microvascular ischemic disease or chronic migraines (given the provided clinical history and frontal predominance). Additional differential considerations include the sequela of prior demyelination or inflammation.  PATIENT SURVEYS:  FOTO unavailable  COGNITION:  Overall cognitive status: Within functional limits for tasks assessed     SENSATION:  Light touch: Appears intact   POSTURE: Fwd head, kyphosis in seated and standing  PALPATION: TTP of R subocc, paraspinals; hypertonicity of bilat UT and levator  CERVICAL AROM/PROM  A/PROM A/PROM (deg) 06/22/2021  Flexion WFL p!- left deviation  Extension WFL- L deviation  Right lateral flexion 60%  Left lateral flexion 60%  Right rotation 75%  Left rotation 75%   (Blank rows = not tested)  CERVICAL SPECIAL TESTS:  Distraction test: Positive    UPPER EXTREMITY AROM/PROM: [facility] during session  UPPER EXTREMITY MMT:  MMT Right 06/23/2021 Left 06/23/2021  Shoulder flexion 4/5 4/5  Shoulder extension 4/5 4/5  Shoulder abduction 4/5 4/5  Shoulder adduction    Shoulder internal rotation 4/5 4/5  Shoulder external rotation 4/5 4/5  (Blank rows = not tested)   JOINT MOBILITY TESTING:  C2-6 extension and rotation stiffness with CPA and bilat UPA     TODAY'S TREATMENT:    Exercises Seated Cervical Retraction - 5-6 x daily - 7 x weekly - 1 sets - 10 reps - 2 hold Standing Shoulder Row with Anchored Resistance - 1 x daily - 7 x weekly - 3 sets - 10 reps Shoulder External Rotation and Scapular Retraction with Resistance - 1 x daily - 7 x weekly - 3 sets - 10 reps    PATIENT EDUCATION: Education details: MOI, diagnosis, prognosis, anatomy, daily postural changes, loading forces with running, exercise progression, DOMS expectations, muscle firing,  envelope of function, HEP, POC  Person educated: Patient Education method: Explanation, Demonstration, Tactile cues, Verbal cues, and  Handouts Education comprehension: verbalized understanding, returned demonstration, verbal cues required, and tactile cues required   HOME EXERCISE PROGRAM: Access Code: [email or web address] URL: [email or web address] Date: 06/23/2021 Prepared by: Zebedee Iba  ASSESSMENT:  CLINICAL IMPRESSION: Patient is a 56  y.o. female who was seen today for physical therapy evaluation and treatment for cc of neck pain. Pt's s/s appear consistent with mechanical neck pain due to history of OA, postural weakness, and upper quarter strength deficits. Pt's pain is minimally sensitive and irritable with movement. Pt is largely strength and joint stiffness limited at this time.   Objective impairments include decreased ROM, decreased strength, hypomobility, increased fascial restrictions, increased muscle spasms, impaired flexibility, impaired UE functional use, improper body mechanics, postural dysfunction, and pain. These impairments are limiting patient from cleaning, community activity, driving, meal prep, occupation, laundry, yard work, shopping, and Naval architect . Personal factors including Age, Past/current experiences, Time since onset of injury/illness/exacerbation, and 1-2 comorbidities:    are also affecting patient's functional outcome. Patient will benefit from skilled PT to address above impairments and improve overall function.  REHAB POTENTIAL: Good  CLINICAL DECISION MAKING: Stable/uncomplicated  EVALUATION COMPLEXITY: Low   GOALS:  SHORT TERM GOALS:  STG Name Target Date Goal status  1 Pt will become independent with HEP in order to demonstrate synthesis of PT education.  Baseline:  08/04/2021 INITIAL  2 Pt will be able to reach [facility] and carry/hold >5 lbs in order to demonstrate functional improvement in upper quarter strength for return to PLOF and exercise.  Baseline: 09/15/2021 INITIAL  3 Pt will report at least 2 pt reduction on NPRS scale for pain in order to demonstrate  functional improvement with household activity, self care, and ADL.  Baseline: 08/04/2021 INITIAL  4 Pt will be able to demonstrate/report ability to walk >15 mins without pain in order to demonstrate functional improvement and tolerance to exercise and community mobility.  Baseline: 08/04/2021 INITIAL   LONG TERM GOALS:   LTG Name Target Date Goal status  1 Pt  will become independent with final HEP in order to demonstrate synthesis of PT education.  Baseline: 09/15/2021 INITIAL  2 Pt will be able to demonstrate ability to run/jog without pain in order to demonstrate functional improvement and tolerance to low level plyometric loading. 09/15/2021 INITIAL  3 Pt will be able to demonstrate full C/S ROM without limitation in order to demonstrate functional improvement in C/S function for self-care and house hold duties.  Baseline: 09/15/2021 INITIAL  4 Pt will be able to demonstrate/report ability to perform OH supported yoga exercise without pain in order to demonstrate functional improvement and tolerance to exercise and CKC loading. 09/15/2021 INITIAL   PLAN: PT FREQUENCY: 1-2x/week  PT DURATION: 12 weeks (likey D/C by 8)  PLANNED INTERVENTIONS: Therapeutic exercises, Therapeutic activity, Neuro Muscular re-education, Balance training, Patient/Family education, Joint mobilization, Aquatic Therapy, Dry Needling, Electrical stimulation, Spinal mobilization, Cryotherapy, Moist heat, Compression bandaging, scar mobilization, Taping, Vasopneumatic device, Traction, Ultrasound, Ionotophoresis 4mg /ml Dexamethasone, and Manual therapy  PLAN FOR NEXT SESSION: review HEP, progress upper quarter strength, SNAGs, pec stretch   Daleen Bo, PT 06/23/2021, 12:18 PM

## 2021-06-23 ENCOUNTER — Encounter (HOSPITAL_BASED_OUTPATIENT_CLINIC_OR_DEPARTMENT_OTHER): Payer: Self-pay | Admitting: Physical Therapy

## 2021-06-23 ENCOUNTER — Ambulatory Visit (HOSPITAL_BASED_OUTPATIENT_CLINIC_OR_DEPARTMENT_OTHER): Payer: BC Managed Care – PPO | Attending: Neurology | Admitting: Physical Therapy

## 2021-06-23 ENCOUNTER — Other Ambulatory Visit: Payer: Self-pay

## 2021-06-23 DIAGNOSIS — M5481 Occipital neuralgia: Secondary | ICD-10-CM | POA: Diagnosis not present

## 2021-06-23 DIAGNOSIS — M542 Cervicalgia: Secondary | ICD-10-CM | POA: Insufficient documentation

## 2021-06-23 DIAGNOSIS — M6281 Muscle weakness (generalized): Secondary | ICD-10-CM | POA: Insufficient documentation

## 2021-07-04 ENCOUNTER — Encounter (HOSPITAL_BASED_OUTPATIENT_CLINIC_OR_DEPARTMENT_OTHER): Payer: Self-pay | Admitting: Physical Therapy

## 2021-07-05 ENCOUNTER — Other Ambulatory Visit: Payer: Self-pay

## 2021-07-05 ENCOUNTER — Ambulatory Visit (HOSPITAL_BASED_OUTPATIENT_CLINIC_OR_DEPARTMENT_OTHER): Payer: BC Managed Care – PPO | Admitting: Physical Therapy

## 2021-07-05 ENCOUNTER — Encounter (HOSPITAL_BASED_OUTPATIENT_CLINIC_OR_DEPARTMENT_OTHER): Payer: Self-pay | Admitting: Physical Therapy

## 2021-07-05 DIAGNOSIS — M6281 Muscle weakness (generalized): Secondary | ICD-10-CM | POA: Diagnosis not present

## 2021-07-05 DIAGNOSIS — M542 Cervicalgia: Secondary | ICD-10-CM

## 2021-07-05 DIAGNOSIS — M5481 Occipital neuralgia: Secondary | ICD-10-CM | POA: Diagnosis not present

## 2021-07-05 NOTE — Therapy (Signed)
OUTPATIENT PHYSICAL THERAPY CERVICAL EVALUATION   Patient Name: Hannah KaufmannHolly A Verga MRN: [id number] DOB:Jul 18, 1965, 56 y.o., female Today's Date: 07/05/2021     Past Medical History:  Diagnosis Date   Family history of colon cancer    LBBB (left bundle branch block)    RA (rheumatoid arthritis) (HCC)    Past Surgical History:  Procedure Laterality Date   DILATION AND CURETTAGE OF UTERUS  1995   PARTIAL MOLE   TONSILLECTOMY     Patient Active Problem List   Diagnosis Date Noted   Cervicalgia 05/08/2021   Occipital neuralgia of right side 05/08/2021   Degenerative disc disease, cervical 05/08/2021   Migraine without aura and without status migrainosus, not intractable 05/08/2021   Osteoarthritis of spine with radiculopathy, cervical region 02/23/2019   Menopausal state 02/23/2019   Rheumatoid arthritis (HCC)     PCP: Orland MustardWolfe, Allison, MD  REFERRING PROVIDER:  Naomie DeanAntonia Ahern MD REFERRING DIAG: M54.2 (ICD-10-CM) - Cervicalgia M54.81 (ICD-10-CM) - Occipital neuralgia of right side M50.30 (ICD-10-CM) - Degenerative disc disease, cervical   THERAPY DIAG:  No diagnosis found.   ONSET DATE: 2020  SUBJECTIVE:                                                                                                                                                                                      SUBJECTIVE STATEMENT: Patient reports her neck is somewhat improved. She has nt tried running. She continues to have pain at night.    Eval: Pt states the pain is related to walking/running. She does have OA and RA in her neck that might be "pressing" on something. Exercising feels great but pain occurs at night after exercise- causes HA. She has not walked this week and currently not in pain. Pt states that neck feels fine at rest but the neck is sore after exercise. Pt does not do resistance training at this time. Has done some more strength based yoga recent. Pt denies vision changes or  dizziness with neck pain or HA. Pt has had history of R sided arm pain that came down the elbow. Pt denies NT into hands.  Pt has seen PT before in the past.    Pt is driving most of the day. Pt is also at a desk at home.   Pt denies cancer red flags.    PERTINENT HISTORY: Per MD note- PMHx osteoarthritis of spine with radiculopathy cervical region, rheumatoid arthritis.   PAIN:  Are you having pain? No 2/15 NPRS scale: None today  Pain location: R or L sided  Pain orientation: Right and Left  PAIN TYPE: radiating pain Pain description: intermittent  Aggravating factors: running/walking,  anything impact- 1-1.5 miles at a time, driving Relieving factors: ibuprofen   PRECAUTIONS: None  WEIGHT BEARING RESTRICTIONS No  FALLS:  Has patient fallen in last 6 months? No Number of falls: 0  LIVING ENVIRONMENT: Lives with: lives with their family   OCCUPATION: [employer]   PLOF: Independent  PATIENT GOALS : Pt states she would like to go back to exercise and return to running if possible without recreating HA.   OBJECTIVE:   DIAGNOSTIC FINDINGS:  FINDINGS: DG cervical spine Disc space narrowing and spurring at C5-6, stable. Mild left neural foraminal narrowing at C5-6 and C3-4 due to uncovertebral spurring and facet disease. No fracture. Normal alignment. Prevertebral soft tissues are normal.   IMPRESSION: Degenerative changes as above. Mild left neural foraminal narrowing at C3-4 and C5-6. No acute bony abnormality.  Brain MR:  IMPRESSION: 1. No evidence of acute intracranial abnormality. 2. Mildly age advanced T2/FLAIR hyperintensities within the bifrontal predominant white matter. These are nonspecific, but most likely represent the sequela of chronic microvascular ischemic disease or chronic migraines (given the provided clinical history and frontal predominance). Additional differential considerations include the sequela of prior demyelination or  inflammation.  PATIENT SURVEYS:  FOTO unavailable  COGNITION:  Overall cognitive status: Within functional limits for tasks assessed     SENSATION:  Light touch: Appears intact   POSTURE: Fwd head, kyphosis in seated and standing  PALPATION: TTP of R subocc, paraspinals; hypertonicity of bilat UT and levator  CERVICAL AROM/PROM  A/PROM A/PROM (deg) 06/22/2021  Flexion WFL p!- left deviation  Extension WFL- L deviation  Right lateral flexion 60%  Left lateral flexion 60%  Right rotation 75%  Left rotation 75%   (Blank rows = not tested)  CERVICAL SPECIAL TESTS:  Distraction test: Positive    UPPER EXTREMITY AROM/PROM: [facility] during session  UPPER EXTREMITY MMT:  MMT Right 07/05/2021 Left 07/05/2021  Shoulder flexion 4/5 4/5  Shoulder extension 4/5 4/5  Shoulder abduction 4/5 4/5  Shoulder adduction    Shoulder internal rotation 4/5 4/5  Shoulder external rotation 4/5 4/5  (Blank rows = not tested)   JOINT MOBILITY TESTING:  C2-6 extension and rotation stiffness with CPA and bilat UPA     TODAY'S TREATMENT:  2/15: Manual: sub-occipital release; trigger point release to upper trap. Reviewed trigger point referral patterns on trigger [email or web address].; reviewed use of the thera-can and how to use it at home.            Seated:  Horizontal abduction 2x10  Upper trap stretch 2x 20 sec hold bilateral  Standing shoulder extension 2x10 red   Patient given green ban for when she is ready to progress.     Eval:  Exercises Seated Cervical Retraction - 5-6 x daily - 7 x weekly - 1 sets - 10 reps - 2 hold Standing Shoulder Row with Anchored Resistance - 1 x daily - 7 x weekly - 3 sets - 10 reps Shoulder External Rotation and Scapular Retraction with Resistance - 1 x daily - 7 x weekly - 3 sets - 10 reps    PATIENT EDUCATION: Education details: MOI, diagnosis, prognosis, anatomy, daily postural changes, loading forces with running, exercise progression, DOMS  expectations, muscle firing,  envelope of function, HEP, POC  Person educated: Patient Education method: Explanation, Demonstration, Tactile cues, Verbal cues, and Handouts Education comprehension: verbalized understanding, returned demonstration, verbal cues required, and tactile cues required   HOME EXERCISE PROGRAM: Access Code: [email or web address] URL: [email or web address] Date: 06/23/2021 Prepared by:  Zebedee Iba  ASSESSMENT:  CLINICAL IMPRESSION: Therapy assessed patients trigger points. She had trigger points in her upper traps and in her sub-occiipital area. Therapy reviewed how these different trigger points could manifest in different headaches. We also reviewed the thera-cane and how she can use that for self trigger point release. She tolerated well. We also expanded upon her HEP. She is having pain at night when she lies on her side. She was shown an upper trap stretch to reduce tension when side sleeping. Therapy will progresses as tolerated.      Objective impairments include decreased ROM, decreased strength, hypomobility, increased fascial restrictions, increased muscle spasms, impaired flexibility, impaired UE functional use, improper body mechanics, postural dysfunction, and pain. These impairments are limiting patient from cleaning, community activity, driving, meal prep, occupation, laundry, yard work, shopping, and Interior and spatial designer . Personal factors including Age, Past/current experiences, Time since onset of injury/illness/exacerbation, and 1-2 comorbidities:    are also affecting patient's functional outcome. Patient will benefit from skilled PT to address above impairments and improve overall function.  REHAB POTENTIAL: Good  CLINICAL DECISION MAKING: Stable/uncomplicated  EVALUATION COMPLEXITY: Low   GOALS:  SHORT TERM GOALS:  STG Name Target Date Goal status  1 Pt will become independent with HEP in order to demonstrate synthesis of PT  education.  Baseline:  08/16/2021 INITIAL  2 Pt will be able to reach [facility] and carry/hold >5 lbs in order to demonstrate functional improvement in upper quarter strength for return to PLOF and exercise.  Baseline: 09/27/2021 INITIAL  3 Pt will report at least 2 pt reduction on NPRS scale for pain in order to demonstrate functional improvement with household activity, self care, and ADL.  Baseline: 08/16/2021 INITIAL  4 Pt will be able to demonstrate/report ability to walk >15 mins without pain in order to demonstrate functional improvement and tolerance to exercise and community mobility.  Baseline: 08/16/2021 INITIAL   LONG TERM GOALS:   LTG Name Target Date Goal status  1 Pt  will become independent with final HEP in order to demonstrate synthesis of PT education.  Baseline: 09/27/2021 INITIAL  2 Pt will be able to demonstrate ability to run/jog without pain in order to demonstrate functional improvement and tolerance to low level plyometric loading. 09/27/2021 INITIAL  3 Pt will be able to demonstrate full C/S ROM without limitation in order to demonstrate functional improvement in C/S function for self-care and house hold duties.  Baseline: 09/27/2021 INITIAL  4 Pt will be able to demonstrate/report ability to perform OH supported yoga exercise without pain in order to demonstrate functional improvement and tolerance to exercise and CKC loading. 09/27/2021 INITIAL   PLAN: PT FREQUENCY: 1-2x/week  PT DURATION: 12 weeks (likey D/C by 8)  PLANNED INTERVENTIONS: Therapeutic exercises, Therapeutic activity, Neuro Muscular re-education, Balance training, Patient/Family education, Joint mobilization, Aquatic Therapy, Dry Needling, Electrical stimulation, Spinal mobilization, Cryotherapy, Moist heat, Compression bandaging, scar mobilization, Taping, Vasopneumatic device, Traction, Ultrasound, Ionotophoresis /ml Dexamethasone, and Manual therapy  PLAN FOR NEXT SESSION: review HEP, progress upper  quarter strength, SNAGs, pec stretch   Dessie Coma, PT 07/05/2021, 1:54 PM

## 2021-07-07 DIAGNOSIS — Z79899 Other long term (current) drug therapy: Secondary | ICD-10-CM | POA: Diagnosis not present

## 2021-07-07 DIAGNOSIS — M0609 Rheumatoid arthritis without rheumatoid factor, multiple sites: Secondary | ICD-10-CM | POA: Diagnosis not present

## 2021-07-07 DIAGNOSIS — M2550 Pain in unspecified joint: Secondary | ICD-10-CM | POA: Diagnosis not present

## 2021-07-07 DIAGNOSIS — M150 Primary generalized (osteo)arthritis: Secondary | ICD-10-CM | POA: Diagnosis not present

## 2021-07-12 ENCOUNTER — Other Ambulatory Visit: Payer: Self-pay

## 2021-07-12 ENCOUNTER — Encounter (HOSPITAL_BASED_OUTPATIENT_CLINIC_OR_DEPARTMENT_OTHER): Payer: Self-pay | Admitting: Physical Therapy

## 2021-07-12 ENCOUNTER — Ambulatory Visit (HOSPITAL_BASED_OUTPATIENT_CLINIC_OR_DEPARTMENT_OTHER): Payer: BC Managed Care – PPO | Admitting: Physical Therapy

## 2021-07-12 DIAGNOSIS — M542 Cervicalgia: Secondary | ICD-10-CM | POA: Diagnosis not present

## 2021-07-12 DIAGNOSIS — M5481 Occipital neuralgia: Secondary | ICD-10-CM | POA: Diagnosis not present

## 2021-07-12 DIAGNOSIS — M6281 Muscle weakness (generalized): Secondary | ICD-10-CM

## 2021-07-12 NOTE — Therapy (Signed)
OUTPATIENT PHYSICAL THERAPY CERVICAL EVALUATION   Patient Name: Hannah Vincent MRN: [id number] DOB:16-Feb-1966, 56 y.o., female Today's Date: 07/12/2021   PT End of Session - 07/12/21 1430     Visit Number 3    Number of Visits 17    Date for PT Re-Evaluation 09/21/21    Authorization Type BCBS    PT Start Time 1350   pt arrives late   PT Stop Time 1430    PT Time Calculation (min) 40 min    Activity Tolerance Patient tolerated treatment well    Behavior During Therapy WFL for tasks assessed/performed              Past Medical History:  Diagnosis Date   Family history of colon cancer    LBBB (left bundle branch block)    RA (rheumatoid arthritis) (HCC)    Past Surgical History:  Procedure Laterality Date   DILATION AND CURETTAGE OF UTERUS  1995   PARTIAL MOLE   TONSILLECTOMY     Patient Active Problem List   Diagnosis Date Noted   Cervicalgia 05/08/2021   Occipital neuralgia of right side 05/08/2021   Degenerative disc disease, cervical 05/08/2021   Migraine without aura and without status migrainosus, not intractable 05/08/2021   Osteoarthritis of spine with radiculopathy, cervical region 02/23/2019   Menopausal state 02/23/2019   Rheumatoid arthritis (HCC)     PCP: Orland Mustard, MD  REFERRING PROVIDER:  Naomie Dean MD REFERRING DIAG: M54.2 (ICD-10-CM) - Cervicalgia M54.81 (ICD-10-CM) - Occipital neuralgia of right side M50.30 (ICD-10-CM) - Degenerative disc disease, cervical   THERAPY DIAG:  Cervicalgia  Muscle weakness (generalized)   ONSET DATE: 2020  SUBJECTIVE:                                                                                                                                                                                      SUBJECTIVE STATEMENT: Pt reports it seems to be getting better. "It is no longer a full blown headache." Pt has not run yet but is walking >1 mile at this time.     Eval: Pt states the pain is related  to walking/running. She does have OA and RA in her neck that might be "pressing" on something. Exercising feels great but pain occurs at night after exercise- causes HA. She has not walked this week and currently not in pain. Pt states that neck feels fine at rest but the neck is sore after exercise. Pt does not do resistance training at this time. Has done some more strength based yoga recent. Pt denies vision changes or dizziness with neck pain or HA. Pt has had history of  R sided arm pain that came down the elbow. Pt denies NT into hands.  Pt has seen PT before in the past.    Pt is driving most of the day. Pt is also at a desk at home.   Pt denies cancer red flags.    PERTINENT HISTORY: Per MD note- PMHx osteoarthritis of spine with radiculopathy cervical region, rheumatoid arthritis.   PAIN:  Are you having pain? No  NPRS scale: None today  Pain location: R or L sided  Pain orientation: Right and Left  PAIN TYPE: radiating pain Pain description: intermittent  Aggravating factors: running/walking, anything impact- 1-1.5 miles at a time, driving Relieving factors: ibuprofen   PRECAUTIONS: None  WEIGHT BEARING RESTRICTIONS No  FALLS:  Has patient fallen in last 6 months? No Number of falls: 0  LIVING ENVIRONMENT: Lives with: lives with their family   OCCUPATION: [employer]   PLOF: Independent  PATIENT GOALS : Pt states she would like to go back to exercise and return to running if possible without recreating HA.   OBJECTIVE:   DIAGNOSTIC FINDINGS:  FINDINGS: DG cervical spine Disc space narrowing and spurring at C5-6, stable. Mild left neural foraminal narrowing at C5-6 and C3-4 due to uncovertebral spurring and facet disease. No fracture. Normal alignment. Prevertebral soft tissues are normal.   IMPRESSION: Degenerative changes as above. Mild left neural foraminal narrowing at C3-4 and C5-6. No acute bony abnormality.  Brain MR:  IMPRESSION: 1. No  evidence of acute intracranial abnormality. 2. Mildly age advanced T2/FLAIR hyperintensities within the bifrontal predominant white matter. These are nonspecific, but most likely represent the sequela of chronic microvascular ischemic disease or chronic migraines (given the provided clinical history and frontal predominance). Additional differential considerations include the sequela of prior demyelination or inflammation.  PATIENT SURVEYS:  FOTO unavailable    TODAY'S TREATMENT:   2/22 Manual: manual traction grade III with subocc hold; bilat UPA grade III C3-7  Supine chin tuck with rotation 10x each direction Pec stretch 30s 3x           Seated:  SNAGS rotation and extension bilat 3s 10x each Suboccipital stretch 5s 10x Horizontal abduction 3x10 GTB Upper trap stretch 2x 20 sec hold bilateral  .  2/15: Manual: sub-occipital release; trigger point release to upper trap. Reviewed trigger point referral patterns on trigger [email or web address].; reviewed use of the thera-can and how to use it at home.            Seated:  Horizontal abduction 2x10  Upper trap stretch 2x 20 sec hold bilateral  Standing shoulder extension 2x10 red   Patient given green ban for when she is ready to progress.     Eval:  Exercises Seated Cervical Retraction - 5-6 x daily - 7 x weekly - 1 sets - 10 reps - 2 hold Standing Shoulder Row with Anchored Resistance - 1 x daily - 7 x weekly - 3 sets - 10 reps Shoulder External Rotation and Scapular Retraction with Resistance - 1 x daily - 7 x weekly - 3 sets - 10 reps    PATIENT EDUCATION: Education details: MOI, diagnosis, prognosis, anatomy, daily postural changes, loading forces with running, exercise progression, DOMS expectations, muscle firing,  envelope of function, HEP, POC  Person educated: Patient Education method: Explanation, Demonstration, Tactile cues, Verbal cues, and Handouts Education comprehension: verbalized understanding, returned  demonstration, verbal cues required, and tactile cues required   HOME EXERCISE PROGRAM: Access Code: [email or web address] URL: [email or web address] Date: 06/23/2021 Prepared  by: Zebedee IbaAlan Keri Veale  ASSESSMENT:  CLINICAL IMPRESSION: Pt with good response to manual therapy and exercise at today's session. Pt with improved rotation and extension ROM by end of session with report of decreased stiffness. Pt responds well to mobility exercise of the neck, upper back, and scapula. Pt able to progress to GTB at today's session and advised to use heavier band at home. Plan to updated HEP at next session if no adverse response to today's session.     Objective impairments include decreased ROM, decreased strength, hypomobility, increased fascial restrictions, increased muscle spasms, impaired flexibility, impaired UE functional use, improper body mechanics, postural dysfunction, and pain. These impairments are limiting patient from cleaning, community activity, driving, meal prep, occupation, laundry, yard work, shopping, and Interior and spatial designerexercise/recreation . Personal factors including Age, Past/current experiences, Time since onset of injury/illness/exacerbation, and 1-2 comorbidities:    are also affecting patient's functional outcome. Patient will benefit from skilled PT to address above impairments and improve overall function.  REHAB POTENTIAL: Good  CLINICAL DECISION MAKING: Stable/uncomplicated  EVALUATION COMPLEXITY: Low   GOALS:  SHORT TERM GOALS:  STG Name Target Date Goal status  1 Pt will become independent with HEP in order to demonstrate synthesis of PT education.  Baseline:  08/23/2021 INITIAL  2 [facility] and carry/hold >5 lbs in order to demonstrate functional improvement in upper quarter strength for return to PLOF and exercise.  Baseline: 10/04/2021 INITIAL  3 Pt will report at least 2 pt reduction on NPRS scale for pain in order to demonstrate functional improvement with  household activity, self care, and ADL.  Baseline: 08/23/2021 INITIAL  4 Pt will be able to demonstrate/report ability to walk >15 mins without pain in order to demonstrate functional improvement and tolerance to exercise and community mobility.  Baseline: 08/23/2021 INITIAL   LONG TERM GOALS:   LTG Name Target Date Goal status  1 Pt  will become independent with final HEP in order to demonstrate synthesis of PT education.  Baseline: 10/04/2021 INITIAL  2 Pt will be able to demonstrate ability to run/jog without pain in order to demonstrate functional improvement and tolerance to low level plyometric loading. 10/04/2021 INITIAL  3 Pt will be able to demonstrate full C/S ROM without limitation in order to demonstrate functional improvement in C/S function for self-care and house hold duties.  Baseline: 10/04/2021 INITIAL  4 Pt will be able to demonstrate/report ability to perform OH supported yoga exercise without pain in order to demonstrate functional improvement and tolerance to exercise and CKC loading. 10/04/2021 INITIAL   PLAN: PT FREQUENCY: 1-2x/week  PT DURATION: 12 weeks (likey D/C by 8)  PLANNED INTERVENTIONS: Therapeutic exercises, Therapeutic activity, Neuro Muscular re-education, Balance training, Patient/Family education, Joint mobilization, Aquatic Therapy, Dry Needling, Electrical stimulation, Spinal mobilization, Cryotherapy, Moist heat, Compression bandaging, scar mobilization, Taping, Vasopneumatic device, Traction, Ultrasound, Ionotophoresis 4mg /ml Dexamethasone, and Manual therapy  PLAN FOR NEXT SESSION: review HEP, progress upper quarter strength, SNAGs, pec stretch   Zebedee IbaAlan Zaydn Gutridge, PT 07/12/2021, 2:31 PM

## 2021-07-19 ENCOUNTER — Other Ambulatory Visit: Payer: Self-pay

## 2021-07-19 ENCOUNTER — Encounter (HOSPITAL_BASED_OUTPATIENT_CLINIC_OR_DEPARTMENT_OTHER): Payer: Self-pay | Admitting: Physical Therapy

## 2021-07-19 ENCOUNTER — Ambulatory Visit (HOSPITAL_BASED_OUTPATIENT_CLINIC_OR_DEPARTMENT_OTHER): Payer: BC Managed Care – PPO | Attending: Neurology | Admitting: Physical Therapy

## 2021-07-19 DIAGNOSIS — M542 Cervicalgia: Secondary | ICD-10-CM | POA: Diagnosis not present

## 2021-07-19 DIAGNOSIS — M6281 Muscle weakness (generalized): Secondary | ICD-10-CM | POA: Diagnosis not present

## 2021-07-19 NOTE — Therapy (Signed)
OUTPATIENT PHYSICAL THERAPY CERVICAL EVALUATION   Patient Name: KEMAYA DORNER MRN: [id number] DOB:Apr 09, 1966, 56 y.o., female Today's Date: 07/19/2021   PT End of Session - 07/19/21 1310     Visit Number 4    Number of Visits 17    Date for PT Re-Evaluation 09/21/21    Authorization Type BCBS    PT Start Time 1307   pt arrives late   PT Stop Time 1340    PT Time Calculation (min) 33 min    Activity Tolerance Patient tolerated treatment well    Behavior During Therapy WFL for tasks assessed/performed               Past Medical History:  Diagnosis Date   Family history of colon cancer    LBBB (left bundle branch block)    RA (rheumatoid arthritis) (HCC)    Past Surgical History:  Procedure Laterality Date   DILATION AND CURETTAGE OF UTERUS  1995   PARTIAL MOLE   TONSILLECTOMY     Patient Active Problem List   Diagnosis Date Noted   Cervicalgia 05/08/2021   Occipital neuralgia of right side 05/08/2021   Degenerative disc disease, cervical 05/08/2021   Migraine without aura and without status migrainosus, not intractable 05/08/2021   Osteoarthritis of spine with radiculopathy, cervical region 02/23/2019   Menopausal state 02/23/2019   Rheumatoid arthritis (HCC)     PCP: Orland Mustard, MD  REFERRING PROVIDER:  Naomie Dean MD REFERRING DIAG: M54.2 (ICD-10-CM) - Cervicalgia M54.81 (ICD-10-CM) - Occipital neuralgia of right side M50.30 (ICD-10-CM) - Degenerative disc disease, cervical   THERAPY DIAG:  Cervicalgia  Muscle weakness (generalized)   ONSET DATE: 2020  SUBJECTIVE:                                                                                                                                                                                      SUBJECTIVE STATEMENT: Pt reports that it felt really good after last session but pain came back after she stopped doing her exercises due to life getting busy.    Eval: Pt states the pain is related  to walking/running. She does have OA and RA in her neck that might be "pressing" on something. Exercising feels great but pain occurs at night after exercise- causes HA. She has not walked this week and currently not in pain. Pt states that neck feels fine at rest but the neck is sore after exercise. Pt does not do resistance training at this time. Has done some more strength based yoga recent. Pt denies vision changes or dizziness with neck pain or HA. Pt has had history of R sided arm pain  that came down the elbow. Pt denies NT into hands.  Pt has seen PT before in the past.    Pt is driving most of the day. Pt is also at a desk at home.   Pt denies cancer red flags.    PERTINENT HISTORY: Per MD note- PMHx osteoarthritis of spine with radiculopathy cervical region, rheumatoid arthritis.   PAIN:  Are you having pain? No  NPRS scale: None today  Pain location: R or L sided  Pain orientation: Right and Left  PAIN TYPE: radiating pain Pain description: intermittent  Aggravating factors: running/walking, anything impact- 1-1.5 miles at a time, driving Relieving factors: ibuprofen   PRECAUTIONS: None  WEIGHT BEARING RESTRICTIONS No  FALLS:  Has patient fallen in last 6 months? No Number of falls: 0  LIVING ENVIRONMENT: Lives with: lives with their family   OCCUPATION: [employer]   PLOF: Independent  PATIENT GOALS : Pt states she would like to go back to exercise and return to running if possible without recreating HA.   OBJECTIVE:   DIAGNOSTIC FINDINGS:  FINDINGS: DG cervical spine Disc space narrowing and spurring at C5-6, stable. Mild left neural foraminal narrowing at C5-6 and C3-4 due to uncovertebral spurring and facet disease. No fracture. Normal alignment. Prevertebral soft tissues are normal.   IMPRESSION: Degenerative changes as above. Mild left neural foraminal narrowing at C3-4 and C5-6. No acute bony abnormality.  Brain MR:  IMPRESSION: 1. No  evidence of acute intracranial abnormality. 2. Mildly age advanced T2/FLAIR hyperintensities within the bifrontal predominant white matter. These are nonspecific, but most likely represent the sequela of chronic microvascular ischemic disease or chronic migraines (given the provided clinical history and frontal predominance). Additional differential considerations include the sequela of prior demyelination or inflammation.  PATIENT SURVEYS:  FOTO unavailable    TODAY'S TREATMENT:   3/1 Manual: manual traction grade III with subocc hold; bilat UPA grade III C3-7  Supine chin tuck with rotation 10x each direction Pec stretch 30s 3x           Seated:  SNAGS rotation and extension bilat 3s 10x each Wall angel 2x10 Open book 5s 10x each Rowing blue TB 3x10 Upper trap stretch 2x 20 sec hold bilateral   2/22 Manual: manual traction grade III with subocc hold; bilat UPA grade III C3-7  Supine chin tuck with rotation 10x each direction Pec stretch 30s 3x           Seated:  SNAGS rotation and extension bilat 3s 10x each Suboccipital stretch 5s 10x Horizontal abduction 3x10 GTB Upper trap stretch 2x 20 sec hold bilateral  .  2/15: Manual: sub-occipital release; trigger point release to upper trap. Reviewed trigger point referral patterns on trigger [email or web address].; reviewed use of the thera-can and how to use it at home.            Seated:  Horizontal abduction 2x10  Upper trap stretch 2x 20 sec hold bilateral  Standing shoulder extension 2x10 red   Patient given green ban for when she is ready to progress.     Eval:  Exercises Seated Cervical Retraction - 5-6 x daily - 7 x weekly - 1 sets - 10 reps - 2 hold Standing Shoulder Row with Anchored Resistance - 1 x daily - 7 x weekly - 3 sets - 10 reps Shoulder External Rotation and Scapular Retraction with Resistance - 1 x daily - 7 x weekly - 3 sets - 10 reps    PATIENT EDUCATION: Education  details: MOI, diagnosis,  prognosis, anatomy, daily postural changes, loading forces with running, exercise progression, DOMS expectations, muscle firing,  envelope of function, HEP, POC  Person educated: Patient Education method: Explanation, Demonstration, Tactile cues, Verbal cues, and Handouts Education comprehension: verbalized understanding, returned demonstration, verbal cues required, and tactile cues required   HOME EXERCISE PROGRAM: Access Code: [email or web address] URL: [email or web address] Date: 06/23/2021 Prepared by: Zebedee Iba  ASSESSMENT:  CLINICAL IMPRESSION: Pt with good response again to manual therapy with improvement in C/S ROM following manual and intro of T/S exercise. Pt with T/S mobility deficits with both rotation and extension as demonstrated by lumbar compensation. Pt HEP updated at this time. Pt may be able to decrease frequency of visits if she is able to trial running without pain.     Objective impairments include decreased ROM, decreased strength, hypomobility, increased fascial restrictions, increased muscle spasms, impaired flexibility, impaired UE functional use, improper body mechanics, postural dysfunction, and pain. These impairments are limiting patient from cleaning, community activity, driving, meal prep, occupation, laundry, yard work, shopping, and Interior and spatial designer . Personal factors including Age, Past/current experiences, Time since onset of injury/illness/exacerbation, and 1-2 comorbidities:    are also affecting patient's functional outcome. Patient will benefit from skilled PT to address above impairments and improve overall function.  REHAB POTENTIAL: Good  CLINICAL DECISION MAKING: Stable/uncomplicated  EVALUATION COMPLEXITY: Low   GOALS:  SHORT TERM GOALS:  STG Name Target Date Goal status  1 Pt will become independent with HEP in order to demonstrate synthesis of PT education.  Baseline:  08/30/2021 INITIAL  2 Pt will be able to reach [facility] and  carry/hold >5 lbs in order to demonstrate functional improvement in upper quarter strength for return to PLOF and exercise.  Baseline: 10/11/2021 INITIAL  3 Pt will report at least 2 pt reduction on NPRS scale for pain in order to demonstrate functional improvement with household activity, self care, and ADL.  Baseline: 08/30/2021 INITIAL  4 Pt will be able to demonstrate/report ability to walk >15 mins without pain in order to demonstrate functional improvement and tolerance to exercise and community mobility.  Baseline: 08/30/2021 INITIAL   LONG TERM GOALS:   LTG Name Target Date Goal status  1 Pt  will become independent with final HEP in order to demonstrate synthesis of PT education.  Baseline: 10/11/2021 INITIAL  2 Pt will be able to demonstrate ability to run/jog without pain in order to demonstrate functional improvement and tolerance to low level plyometric loading. 10/11/2021 INITIAL  3 Pt will be able to demonstrate full C/S ROM without limitation in order to demonstrate functional improvement in C/S function for self-care and house hold duties.  Baseline: 10/11/2021 INITIAL  4 Pt will be able to demonstrate/report ability to perform OH supported yoga exercise without pain in order to demonstrate functional improvement and tolerance to exercise and CKC loading. 10/11/2021 INITIAL   PLAN: PT FREQUENCY: 1-2x/week  PT DURATION: 12 weeks (likey D/C by 8)  PLANNED INTERVENTIONS: Therapeutic exercises, Therapeutic activity, Neuro Muscular re-education, Balance training, Patient/Family education, Joint mobilization, Aquatic Therapy, Dry Needling, Electrical stimulation, Spinal mobilization, Cryotherapy, Moist heat, Compression bandaging, scar mobilization, Taping, Vasopneumatic device, Traction, Ultrasound, Ionotophoresis 4mg /ml Dexamethasone, and Manual therapy  PLAN FOR NEXT SESSION: review HEP, progress upper quarter strength, SNAGs, pec stretch   Zebedee Iba, PT 07/19/2021, 1:45 PM

## 2021-07-28 ENCOUNTER — Other Ambulatory Visit: Payer: Self-pay

## 2021-07-28 ENCOUNTER — Ambulatory Visit (HOSPITAL_BASED_OUTPATIENT_CLINIC_OR_DEPARTMENT_OTHER): Payer: BC Managed Care – PPO | Admitting: Physical Therapy

## 2021-07-28 ENCOUNTER — Encounter (HOSPITAL_BASED_OUTPATIENT_CLINIC_OR_DEPARTMENT_OTHER): Payer: Self-pay | Admitting: Physical Therapy

## 2021-07-28 DIAGNOSIS — M542 Cervicalgia: Secondary | ICD-10-CM

## 2021-07-28 DIAGNOSIS — M6281 Muscle weakness (generalized): Secondary | ICD-10-CM | POA: Diagnosis not present

## 2021-07-28 NOTE — Therapy (Signed)
OUTPATIENT PHYSICAL THERAPY CERVICAL TREATMENT   Patient Name: Hannah Vincent MRN: [id number] DOB:1965-06-27, 56 y.o., female Today's Date: 07/28/2021   PT End of Session - 07/28/21 1108     Visit Number 5    Number of Visits 17    Date for PT Re-Evaluation 09/21/21    Authorization Type BCBS    PT Start Time 1108   pt arrives late   PT Stop Time 1140    PT Time Calculation (min) 32 min    Activity Tolerance Patient tolerated treatment well    Behavior During Therapy WFL for tasks assessed/performed               Past Medical History:  Diagnosis Date   Family history of colon cancer    LBBB (left bundle branch block)    RA (rheumatoid arthritis) (HCC)    Past Surgical History:  Procedure Laterality Date   DILATION AND CURETTAGE OF UTERUS  1995   PARTIAL MOLE   TONSILLECTOMY     Patient Active Problem List   Diagnosis Date Noted   Cervicalgia 05/08/2021   Occipital neuralgia of right side 05/08/2021   Degenerative disc disease, cervical 05/08/2021   Migraine without aura and without status migrainosus, not intractable 05/08/2021   Osteoarthritis of spine with radiculopathy, cervical region 02/23/2019   Menopausal state 02/23/2019   Rheumatoid arthritis (HCC)     PCP: Orland Mustard, MD  REFERRING PROVIDER:  Naomie Dean MD REFERRING DIAG: M54.2 (ICD-10-CM) - Cervicalgia M54.81 (ICD-10-CM) - Occipital neuralgia of right side M50.30 (ICD-10-CM) - Degenerative disc disease, cervical   THERAPY DIAG:  Cervicalgia  Muscle weakness (generalized)   ONSET DATE: 2020  SUBJECTIVE:                                                                                                                                                                                      SUBJECTIVE STATEMENT: Pt states she tried to run. She felt fine during but had some pressure in the neck the next day but did not have a full blown HA.   Eval: Pt states the pain is related to  walking/running. She does have OA and RA in her neck that might be "pressing" on something. Exercising feels great but pain occurs at night after exercise- causes HA. She has not walked this week and currently not in pain. Pt states that neck feels fine at rest but the neck is sore after exercise. Pt does not do resistance training at this time. Has done some more strength based yoga recent. Pt denies vision changes or dizziness with neck pain or HA. Pt has had history of R sided  arm pain that came down the elbow. Pt denies NT into hands.  Pt has seen PT before in the past.    Pt is driving most of the day. Pt is also at a desk at home.   Pt denies cancer red flags.    PERTINENT HISTORY: Per MD note- PMHx osteoarthritis of spine with radiculopathy cervical region, rheumatoid arthritis.   PAIN:  Are you having pain? No  NPRS scale: None today  Pain location: R or L sided  Pain orientation: Right and Left  PAIN TYPE: radiating pain Pain description: intermittent  Aggravating factors: running/walking, anything impact- 1-1.5 miles at a time, driving Relieving factors: ibuprofen   PRECAUTIONS: None  WEIGHT BEARING RESTRICTIONS No  FALLS:  Has patient fallen in last 6 months? No Number of falls: 0  LIVING ENVIRONMENT: Lives with: lives with their family   OCCUPATION: [employer]   PLOF: Independent  PATIENT GOALS : Pt states she would like to go back to exercise and return to running if possible without recreating HA.   OBJECTIVE:   DIAGNOSTIC FINDINGS:  FINDINGS: DG cervical spine Disc space narrowing and spurring at C5-6, stable. Mild left neural foraminal narrowing at C5-6 and C3-4 due to uncovertebral spurring and facet disease. No fracture. Normal alignment. Prevertebral soft tissues are normal.   IMPRESSION: Degenerative changes as above. Mild left neural foraminal narrowing at C3-4 and C5-6. No acute bony abnormality.  Brain MR:  IMPRESSION: 1. No evidence  of acute intracranial abnormality. 2. Mildly age advanced T2/FLAIR hyperintensities within the bifrontal predominant white matter. These are nonspecific, but most likely represent the sequela of chronic microvascular ischemic disease or chronic migraines (given the provided clinical history and frontal predominance). Additional differential considerations include the sequela of prior demyelination or inflammation.  PATIENT SURVEYS:  FOTO unavailable    TODAY'S TREATMENT:   3/10 Manual: manual traction grade III with subocc hold; bilat UPA grade III C3-7  Supine chin tuck with rotation 10x each direction Pec stretch 30s 3x           Seated:  Quadruped chin tuck 2x10 Wall angel 2x10 Levator stretch 30s 3x Rowing blue TB 3x10 Upper trap stretch 2x 20 sec hold bilateral  3/1 Manual: manual traction grade III with subocc hold; bilat UPA grade III C3-7  Supine chin tuck with rotation 10x each direction Pec stretch 30s 3x           Seated:  SNAGS rotation and extension bilat 3s 10x each Wall angel 2x10 Open book 5s 10x each Rowing blue TB 3x10 Upper trap stretch 2x 20 sec hold bilateral   2/22 Manual: manual traction grade III with subocc hold; bilat UPA grade III C3-7  Supine chin tuck with rotation 10x each direction Pec stretch 30s 3x           Seated:  SNAGS rotation and extension bilat 3s 10x each Suboccipital stretch 5s 10x Horizontal abduction 3x10 GTB Upper trap stretch 2x 20 sec hold bilateral  .  2/15: Manual: sub-occipital release; trigger point release to upper trap. Reviewed trigger point referral patterns on trigger [email or web address].; reviewed use of the thera-can and how to use it at home.            Seated:  Horizontal abduction 2x10  Upper trap stretch 2x 20 sec hold bilateral  Standing shoulder extension 2x10 red   Patient given green ban for when she is ready to progress.     Eval:  Exercises Seated Cervical Retraction -  5-6 x daily - 7 x  weekly - 1 sets - 10 reps - 2 hold Standing Shoulder Row with Anchored Resistance - 1 x daily - 7 x weekly - 3 sets - 10 reps Shoulder External Rotation and Scapular Retraction with Resistance - 1 x daily - 7 x weekly - 3 sets - 10 reps    PATIENT EDUCATION: Education details: MOI, diagnosis, prognosis, anatomy, daily postural changes, loading forces with running, exercise progression, DOMS expectations, muscle firing,  envelope of function, HEP, POC  Person educated: Patient Education method: Explanation, Demonstration, Tactile cues, Verbal cues, and Handouts Education comprehension: verbalized understanding, returned demonstration, verbal cues required, and tactile cues required   HOME EXERCISE PROGRAM: Access Code: [email or web address] URL: [email or web address] Date: 06/23/2021 Prepared by: Zebedee IbaAlan Emari Hreha  ASSESSMENT:  CLINICAL IMPRESSION: Pt demonstrates increased stiffness with cervical joints as expected following re-intro to running. Pt had hypertonicity of bilat C/S paraspinals, levator, and UT. Pt able to demonstrate good self management of pain with stretching as well as progression of postural exercise. Pt able to decrease frequency of visits at this time to taper toward independent management.      Objective impairments include decreased ROM, decreased strength, hypomobility, increased fascial restrictions, increased muscle spasms, impaired flexibility, impaired UE functional use, improper body mechanics, postural dysfunction, and pain. These impairments are limiting patient from cleaning, community activity, driving, meal prep, occupation, laundry, yard work, shopping, and Interior and spatial designerexercise/recreation . Personal factors including Age, Past/current experiences, Time since onset of injury/illness/exacerbation, and 1-2 comorbidities:    are also affecting patient's functional outcome. Patient will benefit from skilled PT to address above impairments and improve overall function.  REHAB  POTENTIAL: Good  CLINICAL DECISION MAKING: Stable/uncomplicated  EVALUATION COMPLEXITY: Low   GOALS:  SHORT TERM GOALS:  STG Name Target Date Goal status  1 Pt will become independent with HEP in order to demonstrate synthesis of PT education.  Baseline:  09/08/2021 INITIAL  2 Pt will be able to reach [facility] and carry/hold >5 lbs in order to demonstrate functional improvement in upper quarter strength for return to PLOF and exercise.  Baseline: 10/20/2021 INITIAL  3 Pt will report at least 2 pt reduction on NPRS scale for pain in order to demonstrate functional improvement with household activity, self care, and ADL.  Baseline: 09/08/2021 INITIAL  4 Pt will be able to demonstrate/report ability to walk >15 mins without pain in order to demonstrate functional improvement and tolerance to exercise and community mobility.  Baseline: 09/08/2021 INITIAL   LONG TERM GOALS:   LTG Name Target Date Goal status  1 Pt  will become independent with final HEP in order to demonstrate synthesis of PT education.  Baseline: 10/20/2021 INITIAL  2 Pt will be able to demonstrate ability to run/jog without pain in order to demonstrate functional improvement and tolerance to low level plyometric loading. 10/20/2021 INITIAL  3 Pt will be able to demonstrate full C/S ROM without limitation in order to demonstrate functional improvement in C/S function for self-care and house hold duties.  Baseline: 10/20/2021 INITIAL  4 Pt will be able to demonstrate/report ability to perform OH supported yoga exercise without pain in order to demonstrate functional improvement and tolerance to exercise and CKC loading. 10/20/2021 INITIAL   PLAN: PT FREQUENCY: 1-2x/week  PT DURATION: 12 weeks (likey D/C by 8)  PLANNED INTERVENTIONS: Therapeutic exercises, Therapeutic activity, Neuro Muscular re-education, Balance training, Patient/Family education, Joint mobilization, Aquatic Therapy, Dry Needling, Electrical stimulation, Spinal  mobilization, Cryotherapy, Moist heat, Compression bandaging,  scar mobilization, Taping, Vasopneumatic device, Traction, Ultrasound, Ionotophoresis /ml Dexamethasone, and Manual therapy  PLAN FOR NEXT SESSION: review HEP, progress upper quarter strength   Zebedee Iba, PT 07/28/2021, 11:43 AM

## 2021-08-11 ENCOUNTER — Ambulatory Visit (HOSPITAL_BASED_OUTPATIENT_CLINIC_OR_DEPARTMENT_OTHER): Payer: BC Managed Care – PPO | Admitting: Physical Therapy

## 2021-08-11 ENCOUNTER — Encounter (HOSPITAL_BASED_OUTPATIENT_CLINIC_OR_DEPARTMENT_OTHER): Payer: Self-pay | Admitting: Physical Therapy

## 2021-08-11 DIAGNOSIS — M6281 Muscle weakness (generalized): Secondary | ICD-10-CM | POA: Diagnosis not present

## 2021-08-11 DIAGNOSIS — M542 Cervicalgia: Secondary | ICD-10-CM | POA: Diagnosis not present

## 2021-08-11 NOTE — Therapy (Signed)
?OUTPATIENT PHYSICAL THERAPY CERVICAL TREATMENT ? ?PHYSICAL THERAPY DISCHARGE SUMMARY ? ?Visits from Start of Care: 6 ? ? ?Plan: ?Patient agrees to discharge.  Patient goals were met. Patient is being discharged due to meeting the stated rehab goals.    ? ? ? ? ? ?Patient Name: Hannah Vincent ?MRN: [id number] ?DOB:1966/03/16, 56 y.o., female ?Today's Date: 08/11/2021 ? ? PT End of Session - 08/11/21 1132   ? ? Visit Number 6   ? Number of Visits 17   ? Date for PT Re-Evaluation 09/21/21   ? Authorization Type BCBS   ? PT Start Time 1103   ? PT Stop Time 3094   ? PT Time Calculation (min) 23 min   ? Activity Tolerance Patient tolerated treatment well   ? Behavior During Therapy [facility] for tasks assessed/performed   ? ?  ?  ? ?  ? ? ? ? ? ?Past Medical History:  ?Diagnosis Date  ? Family history of colon cancer   ? LBBB (left bundle branch block)   ? RA (rheumatoid arthritis) ([place])   ? ?Past Surgical History:  ?Procedure Laterality Date  ? [place] OF UTERUS  1995  ? PARTIAL MOLE  ? TONSILLECTOMY    ? ?Patient Active Problem List  ? Diagnosis Date Noted  ? Cervicalgia 05/08/2021  ? Occipital neuralgia of right side 05/08/2021  ? Degenerative disc disease, cervical 05/08/2021  ? Migraine without aura and without status migrainosus, not intractable 05/08/2021  ? Osteoarthritis of spine with radiculopathy, cervical region 02/23/2019  ? Menopausal state 02/23/2019  ? Rheumatoid arthritis ([place])   ? ? ?PCP: No primary care provider on file. ? ?REFERRING PROVIDER:  ?Sarina Ill MD ?REFERRING DIAG: M54.2 (ICD-10-CM) - Cervicalgia M54.81 (ICD-10-CM) - Occipital neuralgia of right side M50.30 (ICD-10-CM) - Degenerative disc disease, cervical  ? ?THERAPY DIAG:  ?Cervicalgia ? ?Muscle weakness (generalized) ? ? ?ONSET DATE: 2020 ? ?SUBJECTIVE:                                                                                                                                                                                      ? ?SUBJECTIVE STATEMENT: ?Pt states she has ran consistently with less HA but did have pain with lifting a heavy case of water. She states she feels 100% back to normal and feels ready to finish with PT. ? ? ?Eval: ?Pt states the pain is related to walking/running. She does have OA and RA in her neck that might be "pressing" on something. Exercising feels great but pain occurs at night after exercise- causes HA. She has not walked this week and currently not in pain. Pt states  that neck feels fine at rest but the neck is sore after exercise. Pt does not do resistance training at this time. Has done some more strength based yoga recent. Pt denies vision changes or dizziness with neck pain or HA. Pt has had history of R sided arm pain that came down the elbow. Pt denies NT into hands.  ?Pt has seen PT before in the past.  ? ? ?Pt is driving most of the day. Pt is also at a desk at home.  ? ?Pt denies cancer red flags.  ? ? ?PERTINENT HISTORY: ?Per MD note- PMHx osteoarthritis of spine with radiculopathy cervical region, rheumatoid arthritis.  ? ?PAIN:  ?Are you having pain? No  ?NPRS scale: None today  ?Pain location: R or L sided  ?Pain orientation: Right and Left  ?PAIN TYPE: radiating pain ?Pain description: intermittent  ?Aggravating factors: running/walking, anything impact- 1-1.5 miles at a time, driving ?Relieving factors: ibuprofen  ? ?PRECAUTIONS: None ? ?WEIGHT BEARING RESTRICTIONS No ? ?FALLS:  ?Has patient fallen in last 6 months? No Number of falls: 0 ? ?LIVING ENVIRONMENT: ?Lives with: lives with their family ? ? ?OCCUPATION: ?[employer]  ? ?PLOF: Independent ? ?PATIENT GOALS : Pt states she would like to go back to exercise and return to running if possible without recreating HA.  ? ?OBJECTIVE:  ? ?DIAGNOSTIC FINDINGS:  ?FINDINGS: DG cervical spine ?Disc space narrowing and spurring at C5-6, stable. Mild left neural ?foraminal narrowing at C5-6 and C3-4 due to uncovertebral spurring ?and facet  disease. No fracture. Normal alignment. Prevertebral soft ?tissues are normal. ?  ?IMPRESSION: ?Degenerative changes as above. Mild left neural foraminal narrowing ?at C3-4 and C5-6. No acute bony abnormality. ? ?Brain MR: ? ?IMPRESSION: ?1. No evidence of acute intracranial abnormality. ?2. Mildly age advanced T2/FLAIR hyperintensities within the ?bifrontal predominant white matter. These are nonspecific, but most ?likely represent the sequela of chronic microvascular ischemic ?disease or chronic migraines (given the provided clinical history ?and frontal predominance). Additional differential considerations ?include the sequela of prior demyelination or inflammation. ? ?CERVICAL AROM/PROM ?  ?A/PROM A/PROM (deg) ?3/24  ?Flexion WFL  ?Extension WFL  ?Right lateral flexion 60%  ?Left lateral flexion 60%  ?Right rotation WFL  ?Left rotation WFL  ? (Blank rows = not tested) ? ?  ? UPPER EXTREMITY AROM/PROM: [facility] during session ?  ?UPPER EXTREMITY MMT: ?  ?MMT Right ?3/24 Left ?3/24  ?Shoulder flexion 4+/5 4+/5  ?Shoulder extension 4+/5 4+/5  ?Shoulder abduction 4+/5 4+/5  ?Shoulder adduction      ?Shoulder internal rotation 4+/5 4+/5  ?Shoulder external rotation 4+/5 4+/5  ?(Blank rows = not tested) ? ? ?  ?TODAY'S TREATMENT:  ? ?3/10 ?Full review of HEP, tapering of exercise, safety with yoga or strength training ? ?Quadruped chin tuck 10x ?Quadruped UE alternating lift 2x10 ? ?3/1 ?Manual: manual traction grade III with subocc hold; bilat UPA grade III C3-7 ? ?Supine chin tuck with rotation 10x each direction ?Pec stretch 30s 3x ? ?         Seated:  ?SNAGS rotation and extension bilat 3s 10x each ?Wall angel 2x10 ?Open book 5s 10x each ?Rowing blue TB 3x10 ?Upper trap stretch 2x 20 sec hold bilateral ? ? ?2/22 ?Manual: manual traction grade III with subocc hold; bilat UPA grade III C3-7 ? ?Supine chin tuck with rotation 10x each direction ?Pec stretch 30s 3x ? ?         Seated:  ?SNAGS rotation and extension  bilat 3s  10x each ?Suboccipital stretch 5s 10x ?Horizontal abduction 3x10 GTB ?Upper trap stretch 2x 20 sec hold bilateral ? ?.  ?2/15: ?Manual: sub-occipital release; trigger point release to upper trap. Reviewed trigger point referral patterns on trigger [email or web address].; reviewed use of the thera-can and how to use it at home.  ? ?         Seated:  ?Horizontal abduction 2x10  ?Upper trap stretch 2x 20 sec hold bilateral ? ?Standing shoulder extension 2x10 red  ? ?Patient given green ban for when she is ready to progress.  ? ? ? ?Eval: ? ?Exercises ?Seated Cervical Retraction - 5-6 x daily - 7 x weekly - 1 sets - 10 reps - 2 hold ?Standing Shoulder Row with Anchored Resistance - 1 x daily - 7 x weekly - 3 sets - 10 reps ?Shoulder External Rotation and Scapular Retraction with Resistance - 1 x daily - 7 x weekly - 3 sets - 10 reps ? ? ? ?PATIENT EDUCATION: ?Education details: loading forces with running, exercise progression, DOMS expectations, muscle firing,  envelope of function, HEP, POC ? ?Person educated: Patient ?Education method: Explanation, Demonstration, Tactile cues, Verbal cues, and Handouts ?Education comprehension: verbalized understanding, returned demonstration, verbal cues required, and tactile cues required ? ? ?HOME EXERCISE PROGRAM: ?Access Code: [email or web address] ?URL: [email or web address] ?Date: 06/23/2021 ?Prepared by: Daleen Bo ? ?ASSESSMENT: ? ?CLINICAL IMPRESSION: ?Pt demonstrates significant objective improvement in ROM as well as UE strength as compared to eval. Pt also reports ability to perform jogging and running without increased pain and has good understanding of postural strength needs. Pt has met all PT goals at this time and requests D/C. D/C this episode of care.  ?  ? ? Objective impairments include decreased ROM, decreased strength, hypomobility, increased fascial restrictions, increased muscle spasms, impaired flexibility, impaired UE functional use, improper body mechanics, postural  dysfunction, and pain. These impairments are limiting patient from cleaning, community activity, driving, meal prep, occupation, laundry, yard work, shopping, and Naval architect . Personal factors

## 2021-08-25 ENCOUNTER — Ambulatory Visit (HOSPITAL_BASED_OUTPATIENT_CLINIC_OR_DEPARTMENT_OTHER): Payer: BC Managed Care – PPO | Admitting: Physical Therapy

## 2021-09-25 DIAGNOSIS — Z01419 Encounter for gynecological examination (general) (routine) without abnormal findings: Secondary | ICD-10-CM | POA: Diagnosis not present

## 2021-09-25 DIAGNOSIS — Z124 Encounter for screening for malignant neoplasm of cervix: Secondary | ICD-10-CM | POA: Diagnosis not present

## 2021-09-25 DIAGNOSIS — Z1211 Encounter for screening for malignant neoplasm of colon: Secondary | ICD-10-CM | POA: Diagnosis not present

## 2021-09-25 DIAGNOSIS — Z1231 Encounter for screening mammogram for malignant neoplasm of breast: Secondary | ICD-10-CM | POA: Diagnosis not present

## 2021-09-25 DIAGNOSIS — Z113 Encounter for screening for infections with a predominantly sexual mode of transmission: Secondary | ICD-10-CM | POA: Diagnosis not present

## 2021-09-25 LAB — HM PAP SMEAR

## 2021-09-25 LAB — RESULTS CONSOLE HPV: CHL HPV: NEGATIVE

## 2021-10-04 DIAGNOSIS — M0609 Rheumatoid arthritis without rheumatoid factor, multiple sites: Secondary | ICD-10-CM | POA: Diagnosis not present

## 2022-01-05 DIAGNOSIS — M0609 Rheumatoid arthritis without rheumatoid factor, multiple sites: Secondary | ICD-10-CM | POA: Diagnosis not present

## 2022-01-05 DIAGNOSIS — L659 Nonscarring hair loss, unspecified: Secondary | ICD-10-CM | POA: Diagnosis not present

## 2022-01-05 DIAGNOSIS — Z79899 Other long term (current) drug therapy: Secondary | ICD-10-CM | POA: Diagnosis not present

## 2022-01-05 DIAGNOSIS — M1991 Primary osteoarthritis, unspecified site: Secondary | ICD-10-CM | POA: Diagnosis not present

## 2022-03-27 ENCOUNTER — Encounter: Payer: Self-pay | Admitting: Family Medicine

## 2022-03-27 ENCOUNTER — Ambulatory Visit (INDEPENDENT_AMBULATORY_CARE_PROVIDER_SITE_OTHER): Payer: BC Managed Care – PPO | Admitting: Family Medicine

## 2022-03-27 VITALS — BP 110/70 | HR 76 | Temp 98.3°F | Ht 65.0 in | Wt 146.1 lb

## 2022-03-27 DIAGNOSIS — L659 Nonscarring hair loss, unspecified: Secondary | ICD-10-CM

## 2022-03-27 DIAGNOSIS — Z0000 Encounter for general adult medical examination without abnormal findings: Secondary | ICD-10-CM

## 2022-03-27 LAB — LIPID PANEL
Cholesterol: 245 mg/dL — ABNORMAL HIGH (ref 0–200)
HDL: 82.5 mg/dL (ref >39.00)
LDL Cholesterol: 142 mg/dL — ABNORMAL HIGH (ref 0–99)
NonHDL: 162.95
Total CHOL/HDL Ratio: 3
Triglycerides: 104 mg/dL (ref 0.0–149.0)
VLDL: 20.8 mg/dL (ref 0.0–40.0)

## 2022-03-27 LAB — IBC + FERRITIN
Ferritin: 19.6 ng/mL (ref 10.0–291.0)
Iron: 110 ug/dL (ref 42–145)
Saturation Ratios: 28 % (ref 20.0–50.0)
TIBC: 393.4 ug/dL (ref 250.0–450.0)
Transferrin: 281 mg/dL (ref 212.0–360.0)

## 2022-03-27 LAB — TSH: TSH: 1.07 u[IU]/mL (ref 0.35–5.50)

## 2022-03-27 LAB — VITAMIN B12: Vitamin B-12: 957 pg/mL — ABNORMAL HIGH (ref 211–911)

## 2022-03-27 NOTE — Patient Instructions (Addendum)
Welcome to [facility]! It was a pleasure meeting you today.  As discussed, Please schedule a 12 month follow up visit today.  Consider tumeric and ginger for arthritis  Consider bioten/Hair and nail supplements.   PLEASE NOTE:  If you had any LAB tests please let us know if you have not heard back within a few days. You may see your results on MyChart before we have a chance to review them but we will give you a call once they are reviewed by [place]. If we ordered any REFERRALS today, please let us know if you have not heard from their office within the next week.  Let us know through MyChart if you are needing REFILLS, or have your pharmacy send [place] the request. You can also use MyChart to communicate with me or any office staff.  Please try these tips to maintain a healthy lifestyle:  Eat most of your calories during the day when you are active. Eliminate processed foods including packaged sweets (pies, cakes, cookies), reduce intake of potatoes, white bread, white pasta, and white rice. Look for whole grain options, oat flour or almond flour.  Each meal should contain half fruits/vegetables, one quarter protein, and one quarter carbs (no bigger than a computer mouse).  Cut down on sweet beverages. This includes juice, soda, and sweet tea. Also watch fruit intake, though this is a healthier sweet option, it still contains natural sugar! Limit to 3 servings daily.  Drink at least 1 glass of water with each meal and aim for at least 8 glasses per day  Exercise at least 150 minutes every week.

## 2022-03-27 NOTE — Progress Notes (Signed)
Phone [phone]   Subjective:   Patient is a 56 y.o. female presenting for annual physical.    Chief Complaint  Patient presents with   Transfer of Care    Transfer of Care due to pcp leaving practice Discuss osteoarthritis Hair loss for about 6 or 7 months  TOC.   Annual OA DIP.  Has RA-on meds and managed by rheum.  Stays active.  2020-some hair loss-may have had covid. Going thru menopause-on HRT.   Over past 6 mo, losing a lot more.  Rheum.  Stressed past 6 mo  See problem oriented charting- ROS- ROS: Gen: no fever, chills  Skin: no rash, itching ENT: no ear pain, ear drainage, nasal congestion, rhinorrhea, sinus pressure, sore throat.  Some allergies.  Eyes: no blurry vision, double vision Resp: no cough, wheeze,SOB CV: no CP,, LE edema,   occ palp. GI: no heartburn, n/v/d/c, abd pain GU: no dysuria, urgency, frequency, hematuria  sees gyn MSK: some.  Neuro: no dizziness, headache, weakness, vertigo Psych: no depression, anxiety, insomnia, SI   The following were reviewed and entered/updated in epic: Past Medical History:  Diagnosis Date   Family history of colon cancer    LBBB (left bundle branch block)    RA (rheumatoid arthritis) (HCC)    Patient Active Problem List   Diagnosis Date Noted   Cervicalgia 05/08/2021   Occipital neuralgia of right side 05/08/2021   Degenerative disc disease, cervical 05/08/2021   Migraine without aura and without status migrainosus, not intractable 05/08/2021   Osteoarthritis of spine with radiculopathy, cervical region 02/23/2019   Menopausal state 02/23/2019   Rheumatoid arthritis (HCC)    Past Surgical History:  Procedure Laterality Date   DILATION AND CURETTAGE OF UTERUS  1995   PARTIAL MOLE   TONSILLECTOMY      Family History  Problem Relation Age of Onset   Birth defects Mother        spinal meliosis   Hypertension Mother    Heart disease Father 45       MI   Colon cancer Maternal Grandfather 40    Migraines Neg Hx     Medications- reviewed and updated Current Outpatient Medications  Medication Sig Dispense Refill   Calcium Carb-Cholecalciferol (CALCIUM + VITAMIN D3) 500-400 MG-UNIT CHEW Calcium 500 + D     D3-50 1.25 MG (50000 UT) capsule TAKE 1 CAPSULE BY MOUTH ONE TIME PER WEEK     estradiol (CLIMARA - DOSED IN MG/24 HR) 0.05 mg/24hr patch APPLY 1 PATCH EVERY WEEK     folic acid (FOLVITE) 1 MG tablet Take 1 mg by mouth daily. 2 tablets daily     meloxicam (MOBIC) 15 MG tablet Take 15 mg by mouth as needed.      methotrexate (RHEUMATREX) 2.5 MG tablet Take 2.5 mg by mouth once a week. Caution:Chemotherapy. Protect from light.     Omega-3 Fatty Acids (FISH OIL) 1000 MG CAPS Fish Oil     progesterone (PROMETRIUM) 100 MG capsule TAKE 1 CAPSULE(S) EVERY DAY BY ORAL ROUTE FOR 12 DAYS.     No current facility-administered medications for this visit.    Allergies-reviewed and updated Allergies  Allergen Reactions   Other     Has RA    Social History   Social History Narrative   Caffeine 2 cups daily.     Education: Buyer, retail   Working:  [employer]   Objective  Objective:  BP 110/70   Pulse 76   Temp  98.3 F (36.8 C) (Temporal)   Ht 5\' 5"  (1.651 m)   Wt 146 lb 2 oz (66.3 kg)   SpO2 97%   BMI 24.32 kg/m  Physical Exam  Gen: WDWN NAD wf HEENT: NCAT, conjunctiva not injected, sclera nonicteric TM WNL B, OP moist, no exudates  NECK:  supple, no thyromegaly, no nodes, no carotid bruits CARDIAC: RRR, S1S2+, no murmur. DP 2+B LUNGS: CTAB. No wheezes ABDOMEN:  BS+, soft, NTND, No HSM, no masses EXT:  no edema MSK: no gross abnormalities. MS 5/5 all 4 NEURO: A&O x3.  CN II-XII intact.  PSYCH: normal mood. Good eye contact  Hands-heberdens Hair-not pulling out easily.    Reviewed labs from rheum 12/2021-cmp,cbc   Assessment and Plan   Health Maintenance counseling: 1. Anticipatory guidance: Patient counseled regarding regular dental exams q6 months, eye  exams,  avoiding smoking and second hand smoke, limiting alcohol to 1 beverage per day, no illicit drugs.   2. Risk factor reduction:  Advised patient of need for regular exercise and diet rich and fruits and vegetables to reduce risk of heart attack and stroke. Exercise- +.  Wt Readings from Last 3 Encounters:  03/27/22 146 lb 2 oz (66.3 kg)  05/03/21 151 lb 8 oz (68.7 kg)  09/16/20 147 lb 6.4 oz (66.9 kg)   3. Immunizations/screenings/ancillary studies Immunization History  Administered Date(s) Administered   Influenza,inj,Quad PF,6+ Mos 02/23/2019   PFIZER(Purple Top)SARS-COV-2 Vaccination 07/31/2019, 08/21/2019, 03/25/2020   There are no preventive care reminders to display for this patient.   4. Cervical cancer screening- gyn 5. Breast cancer screening-  mammogram gyn 6. Colon cancer screening - UTD 7. Skin cancer screening- advised regular sunscreen use. Denies worrisome, changing, or new skin lesions.   Problem List Items Addressed This Visit   None Visit Diagnoses     Wellness examination    -  Primary   Relevant Orders   Lipid panel   TSH   Vitamin B12   IBC + Ferritin   Hair loss       Relevant Orders   TSH   Vitamin B12   IBC + Ferritin      Wellness.  Antic guidance.  Check tsh,lipids,b12, iron studies.  Hair loss-check tsh,B12, iron studies.  May be autoimmune, meds, stress.  Can do biotin hair and nails.  monitor  Recommended follow up: annualReturn in about 1 year (around 03/28/2023) for annual. No future appointments.  Lab/Order associations:ate 1 hr ago fasting   ICD-10-CM   1. Wellness examination  Z00.00 Lipid panel    TSH    Vitamin B12    IBC + Ferritin    2. Hair loss  L65.9 TSH    Vitamin B12    IBC + Ferritin      No orders of the defined types were placed in this encounter.   Wellington Hampshire, MD

## 2022-03-27 NOTE — Progress Notes (Signed)
Labs ok except Your cholesterol levels are elevated.  Work on low cholesterol and lower carbs/sugars diet and  get exercise to try to lower your cholesterol.

## 2022-10-01 DIAGNOSIS — Z01419 Encounter for gynecological examination (general) (routine) without abnormal findings: Secondary | ICD-10-CM | POA: Diagnosis not present

## 2022-10-01 DIAGNOSIS — Z1231 Encounter for screening mammogram for malignant neoplasm of breast: Secondary | ICD-10-CM | POA: Diagnosis not present

## 2022-10-05 DIAGNOSIS — Z79899 Other long term (current) drug therapy: Secondary | ICD-10-CM | POA: Diagnosis not present

## 2022-10-05 DIAGNOSIS — M0609 Rheumatoid arthritis without rheumatoid factor, multiple sites: Secondary | ICD-10-CM | POA: Diagnosis not present

## 2022-10-05 DIAGNOSIS — M1991 Primary osteoarthritis, unspecified site: Secondary | ICD-10-CM | POA: Diagnosis not present

## 2022-12-04 IMAGING — MR MR HEAD WO/W CM
13 series · 48 of 48 positions shown · IV contrast (multihance)
Comparison: None.

CLINICAL DATA: Headache, new or worsening. Neuro deficit, right leg
with tingling/weakness.

EXAM:
MRI HEAD WITHOUT AND WITH CONTRAST
TECHNIQUE: Multiplanar, multiecho pulse sequences of the brain and surrounding
structures were obtained without and with intravenous contrast.
CONTRAST:  11mL MULTIHANCE GADOBENATE DIMEGLUMINE 529 MG/ML IV SOLN

[Series 2: T1 · sagittal · 5.0mm · 0.45mm/px · 2 of 23 slices shown]
[im 1/23]
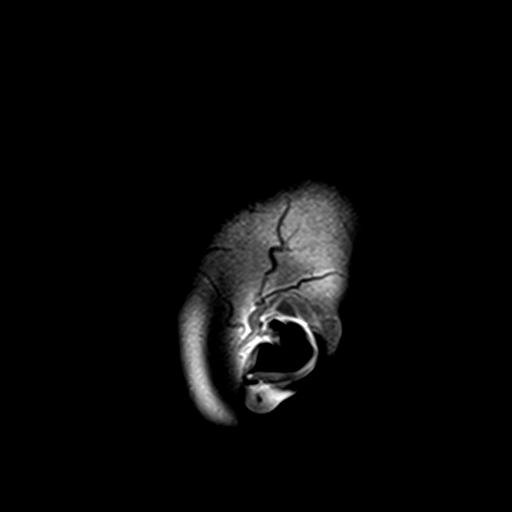
[im 23/23]
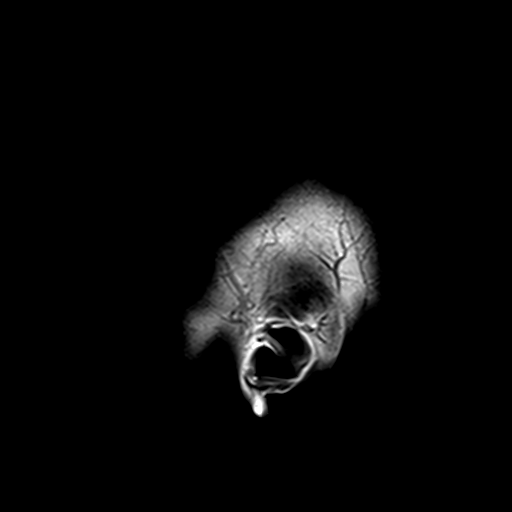

[Series 3: DWI · axial · 3.0mm · 1.80mm/px · z∈[-75,+78]mm · 7 of 103 slices shown (1 of 4)]
[im 1/103]
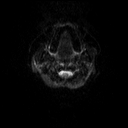
[im 18/103]
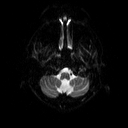
[im 35/103]
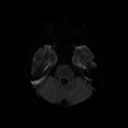
[im 52/103]
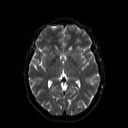
[im 69/103]
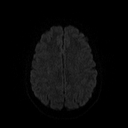
[im 86/103]
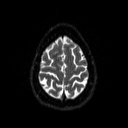
[im 103/103]
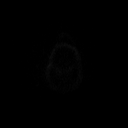

[Series 4: DWI · axial · 3.0mm · 1.80mm/px · z∈[-75,+78]mm · 3 of 52 slices shown (2 of 4)]
[im 1/52]
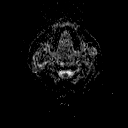
[im 26/52]
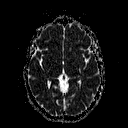
[im 52/52]
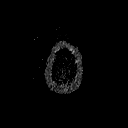

[Series 5: DWI · coronal · 5.0mm · 1.80mm/px · 5 of 73 slices shown (3 of 4)]
[im 1/73]
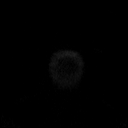
[im 19/73]
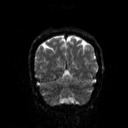
[im 37/73]
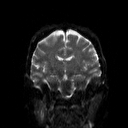
[im 55/73]
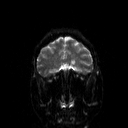
[im 73/73]
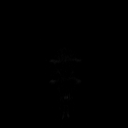

[Series 6: DWI · coronal · 5.0mm · 1.80mm/px · 2 of 37 slices shown (4 of 4)]
[im 1/37]
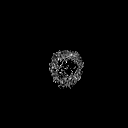
[im 37/37]
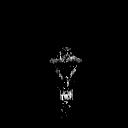

[Series 7: T2 · axial · 5.0mm · 0.60mm/px · 1 of 22 slices shown (1 of 2)]
[im 1/22]
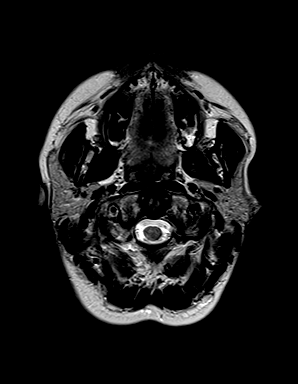

[Series 8: FLAIR · axial · 3.0mm · 0.45mm/px · z∈[-66,+69]mm · 2 of 30 slices shown (1 of 2)]
[im 1/30]
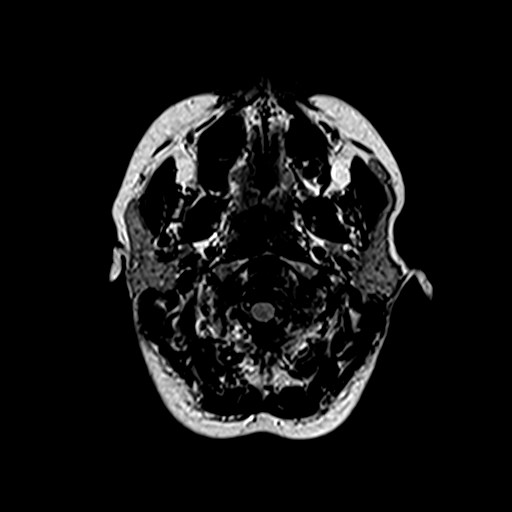
[im 30/30]
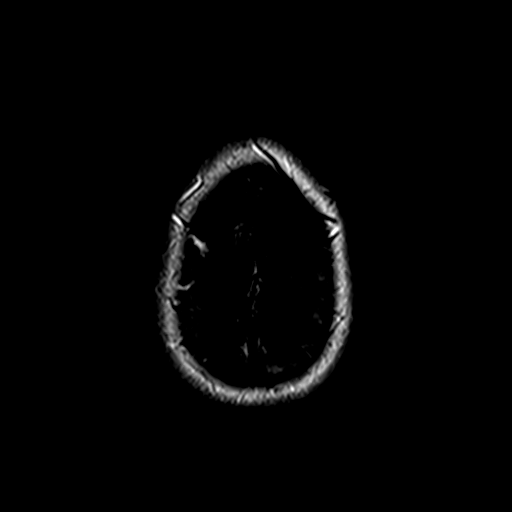

[Series 10: swi_images · axial · 4.0mm · 0.90mm/px · z∈[-68,+72]mm · 2 of 36 slices shown]
[im 1/36]
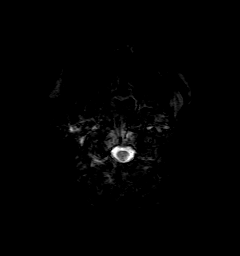
[im 36/36]
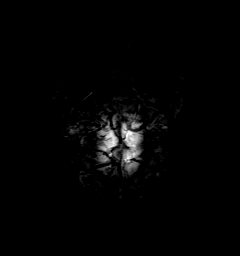

[Series 11: t1_mpr_tra · axial · 1.0mm · 0.75mm/px · z∈[-69,+74]mm · 9 of 144 slices shown]
[im 1/144]
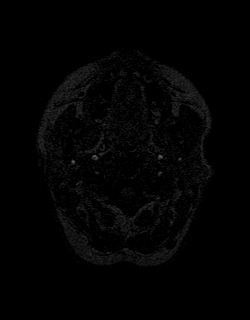
[im 18/144]
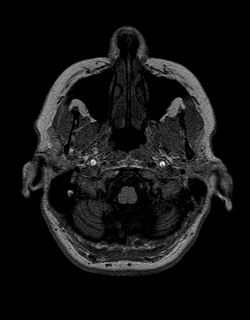
[im 36/144]
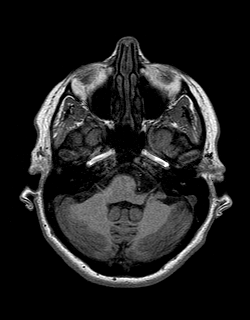
[im 54/144]
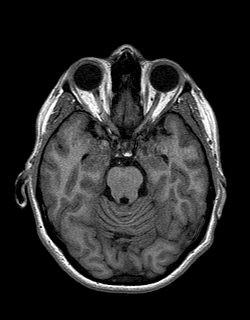
[im 72/144]
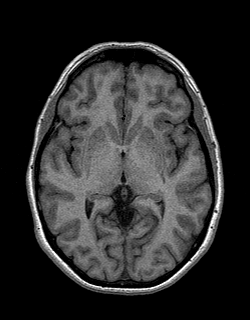
[im 90/144]
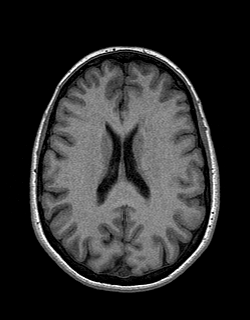
[im 108/144]
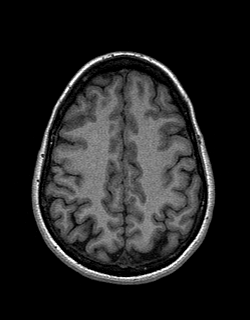
[im 126/144]
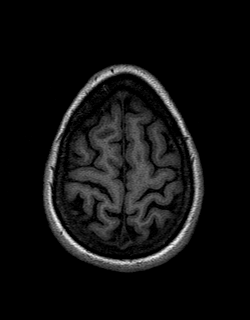
[im 144/144]
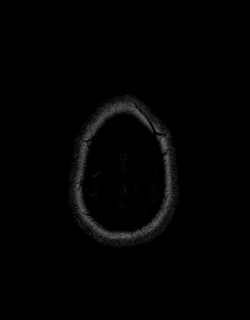

[Series 12: T2 · coronal · 5.0mm · 0.45mm/px · 2 of 28 slices shown (2 of 2)]
[im 1/28]
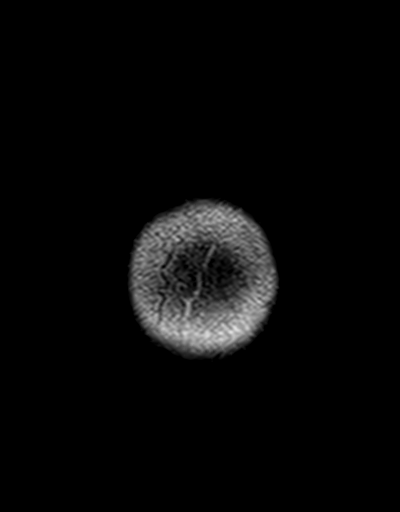
[im 28/28]
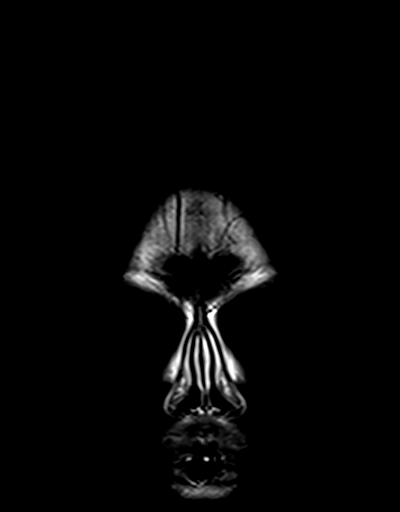

[Series 13: t1_mpr_tra post · axial · 1.0mm · 0.75mm/px · z∈[-69,+74]mm · 9 of 144 slices shown]
[im 1/144]
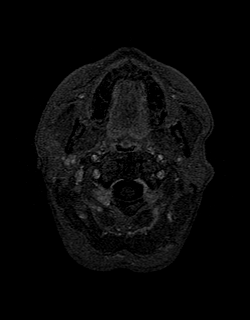
[im 18/144]
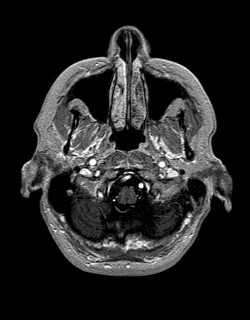
[im 36/144]
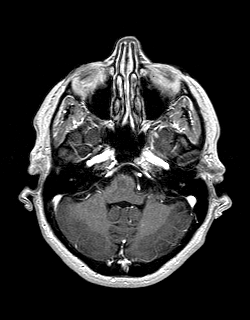
[im 54/144]
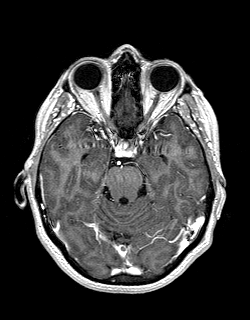
[im 72/144]
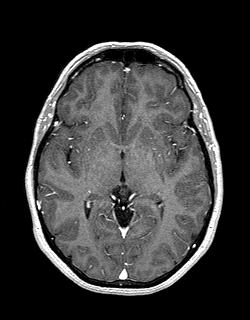
[im 90/144]
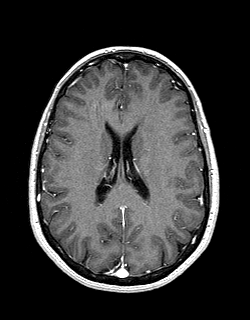
[im 108/144]
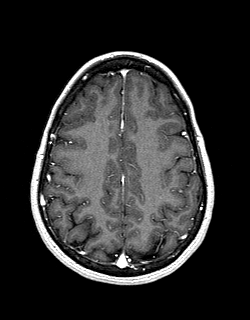
[im 126/144]
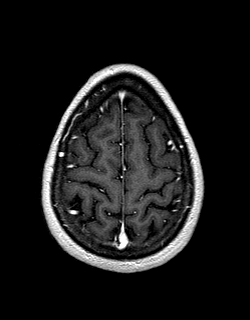
[im 144/144]
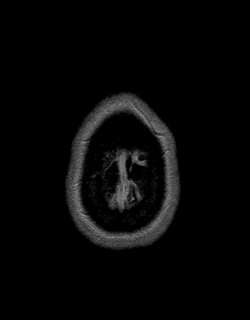

[Series 14: post cor · coronal · 5.0mm · 0.45mm/px · 2 of 28 slices shown]
[im 1/28]
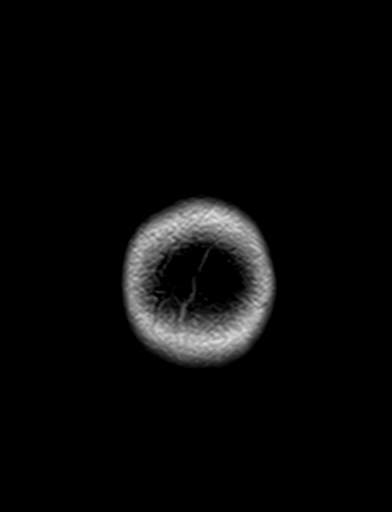
[im 28/28]
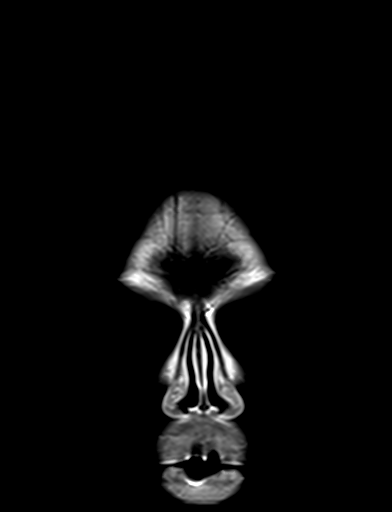

[Series 15: FLAIR · sagittal · 5.0mm · 0.45mm/px · 2 of 25 slices shown (2 of 2)]
[im 1/25]
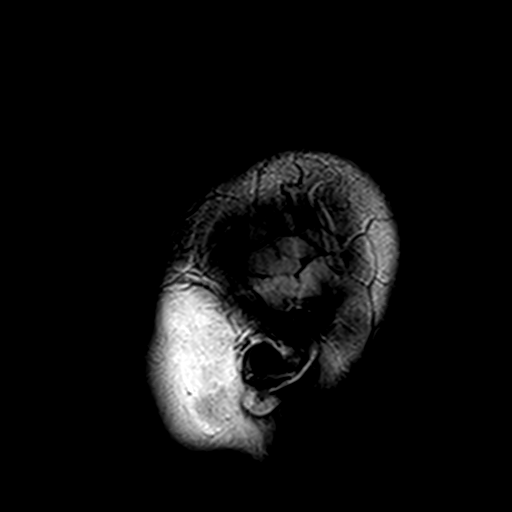
[im 25/25]
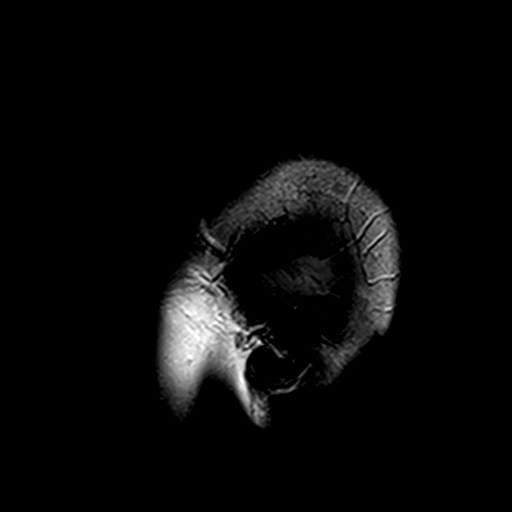

[48 of 48 positions shown; findings below may reference images not displayed]

FINDINGS: Brain: No acute infarction, hemorrhage, hydrocephalus, extra-axial
collection or mass lesion. Mildly advanced for age T2/FLAIR
hyperintensities within the bifrontal predominant white matter. No
abnormal enhancement

Vascular: Major arterial flow voids are maintained at the skull
base.

Skull and upper cervical spine: Normal marrow signal.

Sinuses/Orbits: Clear sinuses.  Unremarkable orbits.

Other: Trace left mastoid effusion.
IMPRESSION: 1. No evidence of acute intracranial abnormality.
2. Mildly age advanced T2/FLAIR hyperintensities within the
bifrontal predominant white matter. These are nonspecific, but most
likely represent the sequela of chronic microvascular ischemic
disease or chronic migraines (given the provided clinical history
and frontal predominance). Additional differential considerations
include the sequela of prior demyelination or inflammation.

## 2022-12-20 ENCOUNTER — Ambulatory Visit (INDEPENDENT_AMBULATORY_CARE_PROVIDER_SITE_OTHER): Payer: BC Managed Care – PPO | Admitting: Podiatry

## 2022-12-20 ENCOUNTER — Encounter: Payer: Self-pay | Admitting: Podiatry

## 2022-12-20 ENCOUNTER — Ambulatory Visit (INDEPENDENT_AMBULATORY_CARE_PROVIDER_SITE_OTHER): Payer: BC Managed Care – PPO

## 2022-12-20 DIAGNOSIS — M7751 Other enthesopathy of right foot: Secondary | ICD-10-CM | POA: Diagnosis not present

## 2022-12-20 DIAGNOSIS — M7661 Achilles tendinitis, right leg: Secondary | ICD-10-CM

## 2022-12-20 DIAGNOSIS — M7750 Other enthesopathy of unspecified foot: Secondary | ICD-10-CM

## 2022-12-20 MED ORDER — TRIAMCINOLONE ACETONIDE 10 MG/ML IJ SUSP
10.0000 mg | Freq: Once | INTRAMUSCULAR | Status: AC
  Administered 2022-12-20: 10 mg via INTRA_ARTICULAR

## 2022-12-20 NOTE — Progress Notes (Signed)
Pain in the posterior right ankle on and off for 1 year No injury, was a runner  Tried HEP, rest from exercise and ibu

## 2022-12-20 NOTE — Patient Instructions (Signed)

## 2022-12-21 NOTE — Progress Notes (Signed)
Subjective:   Patient ID: Bluford Kaufmann, female   DOB: 57 y.o.   MRN: [id number]   HPI Patient presents with a lot of pain in the outside of the right ankle states it has been inflamed she does not remember injury with this.  States it has been present for several months   ROS      Objective:  Physical Exam  Neurovascular status intact with inflammation of the posterior lateral aspect of the right heel at the insertion of the tendon into the calcaneus with no indications of tendon dysfunction     Assessment:  Acute Achilles tendinitis right with inflammation fluid buildup noted     Plan:  H&P reviewed condition.  I have recommended careful injection and I did explain procedure and risk the patient especially of rupture.  She is willing to accept this risk she will not do any forms of activity for the next week and then slowly introduce and I did sterile prep injected the insertion right lateral side where the pain is and stayed away central medial 3 mg Dexasone Kenalog 5 mg Xylocaine advised on ice heel lift and gradual stretching and call if any issues were to occur see back 3 to 4 weeks  X-rays indicate small spur no indication stress fracture arthritis

## 2023-03-15 ENCOUNTER — Ambulatory Visit (INDEPENDENT_AMBULATORY_CARE_PROVIDER_SITE_OTHER): Payer: BC Managed Care – PPO | Admitting: Podiatry

## 2023-03-15 ENCOUNTER — Encounter: Payer: Self-pay | Admitting: Podiatry

## 2023-03-15 DIAGNOSIS — M7671 Peroneal tendinitis, right leg: Secondary | ICD-10-CM

## 2023-03-15 NOTE — Progress Notes (Signed)
Subjective:   Patient ID: Hannah Vincent, female   DOB: 57 y.o.   MRN: [id number]   HPI Patient states she is improved but still having some occasional discomfort right and wanted it checked   ROS      Objective:  Physical Exam  Neurovascular status intact muscle strength adequate patient found to have pain in the lateral right ankle but minimal at this time mild edema within the area     Assessment:  Peroneal tendinitis seems to be improved still present     Plan:  H&P reviewed and I have recommended compression stocking ice therapy and no other treatments unless it were to get worse where eventually may require more aggressive approach or MRI to understand better pathology

## 2023-04-11 DIAGNOSIS — Z79899 Other long term (current) drug therapy: Secondary | ICD-10-CM | POA: Diagnosis not present

## 2023-04-11 DIAGNOSIS — M0609 Rheumatoid arthritis without rheumatoid factor, multiple sites: Secondary | ICD-10-CM | POA: Diagnosis not present

## 2023-04-11 DIAGNOSIS — M1991 Primary osteoarthritis, unspecified site: Secondary | ICD-10-CM | POA: Diagnosis not present

## 2023-07-30 ENCOUNTER — Other Ambulatory Visit: Payer: Self-pay | Admitting: Obstetrics and Gynecology

## 2023-07-30 DIAGNOSIS — Z1231 Encounter for screening mammogram for malignant neoplasm of breast: Secondary | ICD-10-CM

## 2023-08-02 ENCOUNTER — Encounter: Payer: Self-pay | Admitting: Family Medicine

## 2023-08-02 ENCOUNTER — Ambulatory Visit (INDEPENDENT_AMBULATORY_CARE_PROVIDER_SITE_OTHER): Admitting: Family Medicine

## 2023-08-02 VITALS — BP 110/70 | HR 76 | Temp 98.2°F | Resp 16 | Ht 65.0 in | Wt 149.1 lb

## 2023-08-02 DIAGNOSIS — J0100 Acute maxillary sinusitis, unspecified: Secondary | ICD-10-CM

## 2023-08-02 DIAGNOSIS — M25571 Pain in right ankle and joints of right foot: Secondary | ICD-10-CM | POA: Diagnosis not present

## 2023-08-02 DIAGNOSIS — G8929 Other chronic pain: Secondary | ICD-10-CM

## 2023-08-02 MED ORDER — CEFDINIR 300 MG PO CAPS: 300.0000 mg | ORAL_CAPSULE | Freq: Two times a day (BID) | ORAL | 0 refills | Status: DC

## 2023-08-02 MED ORDER — PREDNISONE 20 MG PO TABS: 40.0000 mg | ORAL_TABLET | Freq: Every day | ORAL | 0 refills | Status: AC

## 2023-08-02 NOTE — Progress Notes (Signed)
 Subjective:     Patient ID: Hannah Vincent, female    DOB: 1965/06/12, 58 y.o.   MRN: [id number]  Chief Complaint  Patient presents with   Ear Issue    Pressure in ears and head since January    HPI Discussed the use of AI scribe software for clinical note transcription with the patient, who gave verbal consent to proceed.  History of Present Illness   Hannah Vincent is a 58 year old female who presents with persistent ear pressure and drainage since January.  She has been experiencing persistent ear pressure and drainage since January, initially thought to be an earache. Despite recovering from subsequent illnesses, the pressure and drainage have persisted. The sensation is described as pressure in her ears with occasional, non-severe discharge. She has been using Q-tips to manage the discharge.  She has been frequently ill this winter, with the first illness in January causing ear congestion and pressure. Multiple illnesses have occurred since then, with some improvement between episodes, but the ear symptoms have not fully resolved. She has not taken any antibiotics recently and has not tested for COVID-19. 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 She experienced flu-like symptoms about a month ago, including fever and chills earlier in the week. She has been taking ibuprofen for head pressure and used Mucinex this week.  She has a history of Achilles tendonitis in her right foot, attributed to driving and exercise. A cortisone shot last summer led to increased problems, causing her to stop activities like walking, jogging, and playing pickleball. She is now easing back into exercise but experiences inflammation if she overexerts herself.       Health Maintenance Due  Topic Date Due   Cervical Cancer Screening (HPV/Pap Cotest)  Never done   MAMMOGRAM  06/19/2020    Past Medical History:  Diagnosis Date   Family history of colon cancer     LBBB (left bundle branch block)    RA (rheumatoid arthritis) (HCC)     Past Surgical History:  Procedure Laterality Date   DILATION AND CURETTAGE OF UTERUS  1995   PARTIAL MOLE   TONSILLECTOMY       Current Outpatient Medications:    cefdinir (OMNICEF) 300 MG capsule, Take 1 capsule (300 mg total) by mouth 2 (two) times daily., Disp: 14 capsule, Rfl: 0   D3-50 1.25 MG (50000 UT) capsule, TAKE 1 CAPSULE BY MOUTH ONE TIME PER WEEK, Disp: , Rfl:    estradiol (CLIMARA - DOSED IN MG/24 HR) 0.05 mg/24hr patch, APPLY 1 PATCH EVERY WEEK, Disp: , Rfl:    predniSONE (DELTASONE) 20 MG tablet, Take 2 tablets (40 mg total) by mouth daily with breakfast for 5 days., Disp: 10 tablet, Rfl: 0   progesterone (PROMETRIUM) 100 MG capsule, TAKE 1 CAPSULE(S) EVERY DAY BY ORAL ROUTE FOR 12 DAYS., Disp: , Rfl:    meloxicam (MOBIC) 15 MG tablet, Take 15 mg by mouth as needed.  (Patient not taking: Reported on 08/02/2023), Disp: , Rfl:   Allergies  Allergen Reactions   Other     Has RA   ROS neg/noncontributory except as noted HPI/below      Objective:     BP 110/70   Pulse 76   Temp 98.2 F (36.8 C) (Temporal)   Resp 16   Ht 5\' 5"  (1.651 m)   Wt 149 lb 2 oz (67.6 kg)   SpO2 98%   BMI 24.82  kg/m  Wt Readings from Last 3 Encounters:  08/02/23 149 lb 2 oz (67.6 kg)  03/27/22 146 lb 2 oz (66.3 kg)  05/03/21 151 lb 8 oz (68.7 kg)    Physical Exam   Gen: WDWN NAD HEENT: NCAT, conjunctiva not injected, sclera nonicteric TM WNL B, OP moist, no exudates.  Congested.   NECK:  supple, no thyromegaly, no nodes, CARDIAC: RRR, S1S2+, no murmur.  LUNGS: CTAB. No wheezes EXT:  no edema MSK: no gross abnormalities.  NEURO: A&O x3.  CN II-XII intact.  PSYCH: normal mood. Good eye contact     Assessment & Plan:  Acute non-recurrent maxillary sinusitis  Chronic pain of right ankle  Other orders -     Cefdinir; Take 1 capsule (300 mg total) by mouth 2 (two) times daily.  Dispense: 14 capsule;  Refill: 0 -     predniSONE; Take 2 tablets (40 mg total) by mouth daily with breakfast for 5 days.  Dispense: 10 tablet; Refill: 0  Assessment and Plan    Chronic Ear Congestion and Pressure   Persistent ear congestion and pressure since January, with intermittent drainage, have not fully resolved between episodes of illness. No significant ear pain is reported, and examination shows no perforation or wax obstruction, suggesting a likely underlying chronic infection or inflammation. Prescribe antibiotics to address potential infection and a 5-day course of steroids to reduce inflammation. Consider referral to ENT if symptoms do not improve.  Achilles Tendonitis   Right foot and ankle Achilles tendonitis is likely exacerbated by prolonged driving and a previous cortisone injection. Symptoms include inflammation and swelling with exercise, limiting physical activity. Advise on stretching exercises, including toe raises, flex extensions, and ankle rotations, especially before and after driving. Consider physical therapy if symptoms persist.  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 Recommend wearing a mask in confined spaces, such as a car, during cold and flu season, and regular use of hand sanitizer and cleaning of frequently touched surfaces to reduce exposure to pathogens.        Return in about 3 months (around 11/02/2023) for annual physical.  Angelena Sole, MD

## 2023-08-02 NOTE — Patient Instructions (Signed)
 Consider ENT Consider physical therapy

## 2023-08-09 ENCOUNTER — Other Ambulatory Visit: Payer: Self-pay | Admitting: *Deleted

## 2023-08-09 ENCOUNTER — Encounter: Payer: Self-pay | Admitting: Family Medicine

## 2023-08-09 MED ORDER — CEFDINIR 300 MG PO CAPS: 300.0000 mg | ORAL_CAPSULE | Freq: Two times a day (BID) | ORAL | 0 refills | Status: AC

## 2023-08-13 DIAGNOSIS — J309 Allergic rhinitis, unspecified: Secondary | ICD-10-CM | POA: Diagnosis not present

## 2023-08-13 DIAGNOSIS — H938X3 Other specified disorders of ear, bilateral: Secondary | ICD-10-CM | POA: Diagnosis not present

## 2023-08-13 DIAGNOSIS — H6993 Unspecified Eustachian tube disorder, bilateral: Secondary | ICD-10-CM | POA: Diagnosis not present

## 2023-08-24 DIAGNOSIS — H6503 Acute serous otitis media, bilateral: Secondary | ICD-10-CM | POA: Diagnosis not present

## 2023-08-24 DIAGNOSIS — H6993 Unspecified Eustachian tube disorder, bilateral: Secondary | ICD-10-CM | POA: Diagnosis not present

## 2023-09-12 DIAGNOSIS — H9203 Otalgia, bilateral: Secondary | ICD-10-CM | POA: Diagnosis not present

## 2023-09-12 DIAGNOSIS — H93292 Other abnormal auditory perceptions, left ear: Secondary | ICD-10-CM | POA: Diagnosis not present

## 2023-09-20 DIAGNOSIS — G8929 Other chronic pain: Secondary | ICD-10-CM | POA: Diagnosis not present

## 2023-09-20 DIAGNOSIS — H9203 Otalgia, bilateral: Secondary | ICD-10-CM | POA: Diagnosis not present

## 2023-09-20 DIAGNOSIS — J329 Chronic sinusitis, unspecified: Secondary | ICD-10-CM | POA: Diagnosis not present

## 2023-09-20 DIAGNOSIS — M1909 Primary osteoarthritis, other specified site: Secondary | ICD-10-CM | POA: Diagnosis not present

## 2023-09-20 DIAGNOSIS — H9209 Otalgia, unspecified ear: Secondary | ICD-10-CM | POA: Diagnosis not present

## 2023-11-14 DIAGNOSIS — Z01419 Encounter for gynecological examination (general) (routine) without abnormal findings: Secondary | ICD-10-CM | POA: Diagnosis not present

## 2023-11-28 ENCOUNTER — Encounter: Admitting: Family Medicine

## 2024-01-28 ENCOUNTER — Encounter: Payer: Self-pay | Admitting: Family Medicine

## 2024-01-28 ENCOUNTER — Ambulatory Visit: Admitting: Family Medicine

## 2024-01-28 VITALS — BP 116/73 | HR 74 | Temp 98.4°F | Resp 16 | Ht 65.0 in | Wt 146.0 lb

## 2024-01-28 DIAGNOSIS — Z0000 Encounter for general adult medical examination without abnormal findings: Secondary | ICD-10-CM

## 2024-01-28 LAB — CBC WITH DIFFERENTIAL/PLATELET
Basophils Absolute: 0 K/uL (ref 0.0–0.1)
Basophils Relative: 0.8 % (ref 0.0–3.0)
Eosinophils Absolute: 0.1 K/uL (ref 0.0–0.7)
Eosinophils Relative: 2.4 % (ref 0.0–5.0)
HCT: 40.1 % (ref 36.0–46.0)
Hemoglobin: 13.4 g/dL (ref 12.0–15.0)
Lymphocytes Relative: 48.9 % — ABNORMAL HIGH (ref 12.0–46.0)
Lymphs Abs: 2.3 K/uL (ref 0.7–4.0)
MCHC: 33.5 g/dL (ref 30.0–36.0)
MCV: 88.9 fl (ref 78.0–100.0)
Monocytes Absolute: 0.3 K/uL (ref 0.1–1.0)
Monocytes Relative: 6.1 % (ref 3.0–12.0)
Neutro Abs: 2 K/uL (ref 1.4–7.7)
Neutrophils Relative %: 41.8 % — ABNORMAL LOW (ref 43.0–77.0)
Platelets: 302 K/uL (ref 150.0–400.0)
RBC: 4.51 Mil/uL (ref 3.87–5.11)
RDW: 12.6 % (ref 11.5–15.5)
WBC: 4.7 K/uL (ref 4.0–10.5)

## 2024-01-28 LAB — COMPREHENSIVE METABOLIC PANEL WITH GFR
ALT: 13 U/L (ref 0–35)
AST: 17 U/L (ref 0–37)
Albumin: 4.4 g/dL (ref 3.5–5.2)
Alkaline Phosphatase: 50 U/L (ref 39–117)
BUN: 12 mg/dL (ref 6–23)
CO2: 31 meq/L (ref 19–32)
Calcium: 9.6 mg/dL (ref 8.4–10.5)
Chloride: 103 meq/L (ref 96–112)
Creatinine, Ser: 0.7 mg/dL (ref 0.40–1.20)
GFR: 95.07 mL/min (ref >60.00)
Glucose, Bld: 75 mg/dL (ref 70–99)
Potassium: 3.9 meq/L (ref 3.5–5.1)
Sodium: 140 meq/L (ref 135–145)
Total Bilirubin: 0.4 mg/dL (ref 0.2–1.2)
Total Protein: 6.9 g/dL (ref 6.0–8.3)

## 2024-01-28 LAB — LIPID PANEL
Cholesterol: 250 mg/dL — ABNORMAL HIGH (ref 0–200)
HDL: 76.8 mg/dL (ref >39.00)
LDL Cholesterol: 157 mg/dL — ABNORMAL HIGH (ref 0–99)
NonHDL: 172.89
Total CHOL/HDL Ratio: 3
Triglycerides: 80 mg/dL (ref 0.0–149.0)
VLDL: 16 mg/dL (ref 0.0–40.0)

## 2024-01-28 LAB — TSH: TSH: 0.74 u[IU]/mL (ref 0.35–5.50)

## 2024-01-28 LAB — HEMOGLOBIN A1C: Hgb A1c MFr Bld: 6.1 % (ref 4.6–6.5)

## 2024-01-28 NOTE — Progress Notes (Signed)
 Phone [phone]   Subjective:   Patient is a 58 y.o. female presenting for annual physical.    Chief Complaint  Patient presents with   Annual Exam    CPE Not fasting Possible carpal tunnel    Annual-exercises Discussed the use of AI scribe software for clinical note transcription with the patient, who gave verbal consent to proceed.  History of Present Illness Hannah Vincent is a 58 year old female who presents for an annual physical exam.  She discontinued hormone replacement therapy in March due to concerns about inflammation and other symptoms, including ear issues and 'weird symptoms.' After stopping the therapy, she noticed a reduction in inflammation. She experienced hot flashes and heart palpitations for a few months post-discontinuation, which have since resolved. She occasionally took baby aspirin to manage the palpitations.  She experiences carpal tunnel symptoms in her right hand, particularly after activities involving overuse or stress, such as cleaning. The symptoms include pain, numbness, and tingling, often waking her at night. Relief is found by dangling her hand, although this disrupts her sleep. She has not yet used a wrist splint.  She has a history of heart palpitations associated with hormonal changes post-menopause. These palpitations occurred primarily at night, causing her to wake with a racing heart, but have not been an issue in recent months.  She occasionally consumes alcohol, specifically wine, but does not smoke or vape. She has returned to exercising over the past three to four months after a year-long break due to an ankle injury. She notes increased sensitivity to caffeine since menopause, which she manages by reducing her intake.  No headaches, dizziness, vision changes, sore throat, hoarseness, swallowing difficulties, rashes, gastrointestinal issues, urinary issues, or symptoms of depression. She occasionally uses Flonase for allergies.    See  problem oriented charting- ROS- ROS: Gen: no fever, chills  Skin: no rash, itching ENT: no ear pain, ear drainage, nasal congestion, rhinorrhea, sinus pressure, sore throat Eyes: no blurry vision, double vision Resp: no cough, wheeze,SOB CV: no CP, LE edema,  GI: no heartburn, n/v/d/c, abd pain GU: no dysuria, urgency, frequency, hematuria FDX:dnfz R cts Neuro: no dizziness, headache, weakness, vertigo Psych: no depression, anxiety, insomnia, SI   The following were reviewed and entered/updated in epic: Past Medical History:  Diagnosis Date   Family history of colon cancer    LBBB (left bundle branch block)    RA (rheumatoid arthritis) (HCC)    Patient Active Problem List   Diagnosis Date Noted   Cervicalgia 05/08/2021   Occipital neuralgia of right side 05/08/2021   Degenerative disc disease, cervical 05/08/2021   Migraine without aura and without status migrainosus, not intractable 05/08/2021   Osteoarthritis of spine with radiculopathy, cervical region 02/23/2019   Menopausal state 02/23/2019   Rheumatoid arthritis (HCC)    Past Surgical History:  Procedure Laterality Date   DILATION AND CURETTAGE OF UTERUS  1995   PARTIAL MOLE   TONSILLECTOMY      Family History  Problem Relation Age of Onset   Birth defects Mother        spinal meliosis   Hypertension Mother    Heart disease Father 56       MI   Colon cancer Maternal Grandfather 40   Migraines Neg Hx     Medications- reviewed and updated No current outpatient medications on file.   No current facility-administered medications for this visit.    Allergies-reviewed and updated Allergies  Allergen Reactions   Other  Has RA    Social History   Social History Narrative   Caffeine 2 cups daily.     Education: Buyer, retail   Working:  [employer]   Objective  Objective:  BP 116/73   Pulse 74   Temp 98.4 F (36.9 C) (Temporal)   Resp 16   Ht 5' 5 (1.651 m)   Wt 146 lb (66.2 kg)    SpO2 97%   BMI 24.30 kg/m  Physical Exam  Gen: WDWN NAD HEENT: NCAT, conjunctiva not injected, sclera nonicteric TM WNL B, OP moist, no exudates  NECK:  supple, no thyromegaly, no nodes, no carotid bruits CARDIAC: RRR, S1S2+, no murmur. DP 2+B LUNGS: CTAB. No wheezes ABDOMEN:  BS+, soft, NTND, No HSM, no masses EXT:  no edema MSK: no gross abnormalities. MS 5/5 all 4 NEURO: A&O x3.  CN II-XII intact.  PSYCH: normal mood. Good eye contact      Assessment and Plan   Health Maintenance counseling: 1. Anticipatory guidance: Patient counseled regarding regular dental exams q6 months, eye exams,  avoiding smoking and second hand smoke, limiting alcohol to 1 beverage per day, no illicit drugs.   2. Risk factor reduction:  Advised patient of need for regular exercise and diet rich and fruits and vegetables to reduce risk of heart attack and stroke. Exercise- +.  Wt Readings from Last 3 Encounters:  01/28/24 146 lb (66.2 kg)  08/02/23 149 lb 2 oz (67.6 kg)  03/27/22 146 lb 2 oz (66.3 kg)   3. Immunizations/screenings/ancillary studies Immunization History  Administered Date(s) Administered   Influenza Inj Mdck Quad Pf 04/18/2020   Influenza, Seasonal, Injecte, Preservative Fre 04/18/2020   Influenza,inj,Quad PF,6+ Mos 02/23/2019   PFIZER(Purple Top)SARS-COV-2 Vaccination 07/31/2019, 08/21/2019, 03/25/2020   Health Maintenance Due  Topic Date Due   Hepatitis B Vaccines 19-59 Average Risk (1 of 3 - 19+ 3-dose series) Never done   MAMMOGRAM  06/19/2020   Influenza Vaccine  12/20/2023    4. Cervical cancer screening: gyn June.  5. Skin cancer screening- advised regular sunscreen use. Denies worrisome, changing, or new skin lesions.  6. Birth control/STD check: post men 7. Smoking associated screening: non smoker 8. Alcohol screening: rare  Wellness examination -     Lipid panel -     Comprehensive metabolic panel with GFR -     CBC with Differential/Platelet -     Hemoglobin  A1c -     TSH   Wellness-anticipatory guidance.  Work on Diet/Exercise  Check CBC,CMP,lipids,TSH, A1C.  F/u 1 yr  Assessment and Plan Assessment & Plan Adult Wellness Visit   Routine adult wellness visit with no new surgeries or changes in family history. She consumes alcohol occasionally, does not smoke or vape, and has resumed regular exercise after an ankle injury. Pap smear is current, mammogram is pending, and colonoscopy is due in 2028. Blood work will be performed today despite a recent meal. Schedule mammogram.  Postmenopausal state   She is postmenopausal and discontinued hormone replacement therapy in March due to suspected estrogen-related inflammation. Initial symptoms of hot flashes and heart palpitations have resolved. She has increased sensitivity to caffeine.  Right carpal tunnel syndrome   She experiences intermittent pain and numbness, worsened by overuse and stress, occurring at night and relieved by dangling the hand. Recommend wearing a wrist splint at night during flare-ups. Advise use of wrist splint for 6-8 weeks if symptoms persist. Consider referral to orthopedics if symptoms worsen or functional impairment  occurs.  Allergic rhinitis   She occasionally uses Flonase for allergy symptoms and reports no current symptoms.   General Health Maintenance   Vaccinations discussed include shingles, tetanus, and pneumonia. Shingles vaccine is considered due to potential complications, especially in individuals with autoimmune issues. Tetanus vaccine status is uncertain and likely due. Pneumonia vaccine is recommended for age 47 and above. She is considering the shingles vaccine but wants to plan it when rested. She is uncertain about the pneumonia vaccine due to personal family views on end-of-life care. Consider shingles vaccine. Consider tetanus vaccine if last dose was over 10 years ago. Consider pneumonia vaccine.  Follow-Up   Follow-up plans discussed for routine health  maintenance and vaccinations. Schedule mammogram. Consider scheduling vaccinations as discussed.    Recommended follow up: Return in about 1 year (around 01/27/2025) for annual physical.  Lab/Order associations:1.5 hrs fasting   Jenkins CHRISTELLA Carrel, MD

## 2024-01-28 NOTE — Patient Instructions (Signed)
 It was very nice to see you today!  Consider pneumonia, shingles, tetatus immunizations   PLEASE NOTE:  If you had any lab tests please let us  know if you have not heard back within a few days. You may see your results on MyChart before we have a chance to review them but we will give you a call once they are reviewed by us . If we ordered any referrals today, please let us  know if you have not heard from their office within the next week.   Please try these tips to maintain a healthy lifestyle:  Eat most of your calories during the day when you are active. Eliminate processed foods including packaged sweets (pies, cakes, cookies), reduce intake of potatoes, white bread, white pasta, and white rice. Look for whole grain options, oat flour or almond flour.  Each meal should contain half fruits/vegetables, one quarter protein, and one quarter carbs (no bigger than a computer mouse).  Cut down on sweet beverages. This includes juice, soda, and sweet tea. Also watch fruit intake, though this is a healthier sweet option, it still contains natural sugar! Limit to 3 servings daily.  Drink at least 1 glass of water with each meal and aim for at least 8 glasses per day  Exercise at least 150 minutes every week.

## 2024-01-29 ENCOUNTER — Ambulatory Visit: Payer: Self-pay | Admitting: Family Medicine

## 2024-01-29 NOTE — Progress Notes (Signed)
 Labs great except  Your cholesterol levels are elevated.  Work on low cholesterol and lower carbs/sugars diet and  get exercise to try to lower your cholesterol.  Consider meds A1C(3 month average of sugars) is elevated.  This is considered PreDiabetes.  Work on diet-decrease sugars and starches and aim for 30 minutes of exercise 5 days/week to prevent progression to diabetes  Sch appt in 6 months w/me for reck

## 2024-02-03 ENCOUNTER — Encounter: Payer: Self-pay | Admitting: Family Medicine

## 2024-07-13 ENCOUNTER — Ambulatory Visit: Admitting: Family Medicine

## 2024-07-13 ENCOUNTER — Other Ambulatory Visit
